# Patient Record
Sex: Female | Born: 1940 | Race: White | Hispanic: No | Smoking: Never smoker
Health system: Southern US, Community
[De-identification: ages and names within clinical notes are randomized; demographics above are authoritative.]

## PROBLEM LIST (undated history)

## (undated) DIAGNOSIS — I10 Essential (primary) hypertension: Secondary | ICD-10-CM

## (undated) DIAGNOSIS — I4891 Unspecified atrial fibrillation: Secondary | ICD-10-CM

## (undated) DIAGNOSIS — M810 Age-related osteoporosis without current pathological fracture: Secondary | ICD-10-CM

## (undated) DIAGNOSIS — C50919 Malignant neoplasm of unspecified site of unspecified female breast: Secondary | ICD-10-CM

## (undated) HISTORY — DX: Essential (primary) hypertension: I10

## (undated) HISTORY — PX: WRIST FRACTURE SURGERY: SHX121

## (undated) HISTORY — DX: Age-related osteoporosis without current pathological fracture: M81.0

## (undated) HISTORY — PX: OTHER SURGICAL HISTORY: SHX169

## (undated) HISTORY — PX: FRACTURE SURGERY: SHX138

---

## 2009-07-15 HISTORY — PX: ANKLE SURGERY: SHX546

## 2011-11-22 DIAGNOSIS — M810 Age-related osteoporosis without current pathological fracture: Secondary | ICD-10-CM | POA: Diagnosis present

## 2014-07-11 ENCOUNTER — Ambulatory Visit: Payer: Self-pay | Admitting: Internal Medicine

## 2015-06-06 ENCOUNTER — Other Ambulatory Visit: Payer: Self-pay | Admitting: Internal Medicine

## 2015-06-06 DIAGNOSIS — Z1231 Encounter for screening mammogram for malignant neoplasm of breast: Secondary | ICD-10-CM

## 2015-07-13 ENCOUNTER — Other Ambulatory Visit: Payer: Self-pay | Admitting: Internal Medicine

## 2015-07-13 ENCOUNTER — Ambulatory Visit
Admission: RE | Admit: 2015-07-13 | Discharge: 2015-07-13 | Disposition: A | Discharge status: Home / Self Care | Payer: Medicare Other | Source: Ambulatory Visit | Attending: Internal Medicine | Admitting: Internal Medicine

## 2015-07-13 DIAGNOSIS — R921 Mammographic calcification found on diagnostic imaging of breast: Secondary | ICD-10-CM | POA: Insufficient documentation

## 2015-07-13 DIAGNOSIS — Z1231 Encounter for screening mammogram for malignant neoplasm of breast: Secondary | ICD-10-CM | POA: Insufficient documentation

## 2015-07-16 DIAGNOSIS — C50919 Malignant neoplasm of unspecified site of unspecified female breast: Secondary | ICD-10-CM

## 2015-07-16 HISTORY — DX: Malignant neoplasm of unspecified site of unspecified female breast: C50.919

## 2015-07-20 ENCOUNTER — Other Ambulatory Visit: Payer: Self-pay | Admitting: Internal Medicine

## 2015-07-20 DIAGNOSIS — R928 Other abnormal and inconclusive findings on diagnostic imaging of breast: Secondary | ICD-10-CM

## 2015-07-28 ENCOUNTER — Ambulatory Visit
Admission: RE | Admit: 2015-07-28 | Discharge: 2015-07-28 | Disposition: A | Discharge status: Home / Self Care | Payer: Medicare Other | Source: Ambulatory Visit | Attending: Internal Medicine | Admitting: Internal Medicine

## 2015-07-28 ENCOUNTER — Ambulatory Visit: Payer: Medicare Other

## 2015-07-28 DIAGNOSIS — Z1231 Encounter for screening mammogram for malignant neoplasm of breast: Secondary | ICD-10-CM | POA: Insufficient documentation

## 2015-07-28 DIAGNOSIS — R921 Mammographic calcification found on diagnostic imaging of breast: Secondary | ICD-10-CM | POA: Insufficient documentation

## 2015-07-28 DIAGNOSIS — R928 Other abnormal and inconclusive findings on diagnostic imaging of breast: Secondary | ICD-10-CM

## 2015-08-01 ENCOUNTER — Other Ambulatory Visit: Payer: Self-pay | Admitting: Internal Medicine

## 2015-08-01 DIAGNOSIS — R920 Mammographic microcalcification found on diagnostic imaging of breast: Secondary | ICD-10-CM

## 2015-08-08 ENCOUNTER — Ambulatory Visit
Admission: RE | Admit: 2015-08-08 | Discharge: 2015-08-08 | Disposition: A | Discharge status: Home / Self Care | Payer: Medicare Other | Source: Ambulatory Visit | Attending: Internal Medicine | Admitting: Internal Medicine

## 2015-08-08 DIAGNOSIS — D0581 Other specified type of carcinoma in situ of right breast: Secondary | ICD-10-CM | POA: Diagnosis not present

## 2015-08-08 DIAGNOSIS — R920 Mammographic microcalcification found on diagnostic imaging of breast: Secondary | ICD-10-CM

## 2015-08-08 DIAGNOSIS — N63 Unspecified lump in breast: Secondary | ICD-10-CM | POA: Diagnosis present

## 2015-08-08 HISTORY — PX: BREAST BIOPSY: SHX20

## 2015-08-09 LAB — SURGICAL PATHOLOGY

## 2015-08-11 ENCOUNTER — Ambulatory Visit (INDEPENDENT_AMBULATORY_CARE_PROVIDER_SITE_OTHER): Payer: Medicare Other | Admitting: General Surgery

## 2015-08-11 ENCOUNTER — Other Ambulatory Visit: Payer: Medicare Other

## 2015-08-11 ENCOUNTER — Encounter: Payer: Self-pay | Admitting: General Surgery

## 2015-08-11 VITALS — BP 124/68 | HR 70 | Resp 14 | Ht 65.0 in | Wt 143.0 lb

## 2015-08-11 DIAGNOSIS — N6320 Unspecified lump in the left breast, unspecified quadrant: Secondary | ICD-10-CM

## 2015-08-11 DIAGNOSIS — N63 Unspecified lump in breast: Secondary | ICD-10-CM | POA: Diagnosis not present

## 2015-08-11 DIAGNOSIS — D0511 Intraductal carcinoma in situ of right breast: Secondary | ICD-10-CM | POA: Diagnosis not present

## 2015-08-11 DIAGNOSIS — N6310 Unspecified lump in the right breast, unspecified quadrant: Secondary | ICD-10-CM

## 2015-08-11 NOTE — Patient Instructions (Signed)
The patient is scheduled for a left breast Finesse biopsy in office on 08/15/15 at 8:15 am. She is scheduled for surgery at [facility] on 08/22/15. The patient will pre admit by phone. She is aware of dates, time, and instructions.

## 2015-08-11 NOTE — Progress Notes (Signed)
Patient ID: Cassie Scarce, PhD, female   DOB: [birth year], 75 y.o.   MRN: [id number]  Chief Complaint  Patient presents with  . Other    mammogram and right breast biopsy    HPI Cassie Scarce, PhD (englishe) is a 75 y.o. female  who presents for a breast evaluation. The most recent mammogram was done on 07/13/15 and added views on 07/28/15 ,right breast stereo performed on 08/08/15.  Patient does not perform regular self breast checks and gets regular mammograms done. The patient had a routine mammogram followed by additional views and subsequent biopsy confirming a 2 mm area of DCIS. She is seen to discuss treatment options.  Her past history is remarkable for a biopsy on the left side in the distant past which was benign.  There is no history of breast trauma. The patient is still employed writing for the local newspaper. She is a native of [place], [place].   The patient is accompanied by her husband, Cassie Grippe, PhD (psych).  I personally reviewed the patient's history.  HPI  Past Medical History  Diagnosis Date  . Hypertension   . Osteoporosis     Past Surgical History  Procedure Laterality Date  . Ankle surgery Left 2011  . Breast biopsy Right 08/08/15    path pending, stereo  . Left breast excision Left     Family History  Problem Relation Age of Onset  . Cancer Neg Hx     Social History Social History  Substance Use Topics  . Smoking status: Never Smoker   . Smokeless tobacco: None  . Alcohol Use: No    Allergies  Allergen Reactions  . Bactericin [Bacitracin] Rash    Current Outpatient Prescriptions  Medication Sig Dispense Refill  . aspirin 81 MG tablet Take 81 mg by mouth daily.    Marland Kitchen b complex vitamins tablet Take 1 tablet by mouth daily.    . Cholecalciferol (VITAMIN D3) 1000 units CAPS Take by mouth daily.    . fenoprofen (NALFON) 600 MG TABS tablet Take 600 mg by mouth.    . Omega-3 Fatty Acids (FISH OIL) 1000 MG CAPS Take by  mouth.    . Selenium 200 MCG CAPS Take by mouth.     No current facility-administered medications for this visit.    Review of Systems Review of Systems  Constitutional: Negative.  Negative for unexpected weight change.  HENT: Negative.   Eyes: Negative.   Respiratory: Negative.   Cardiovascular: Negative.   Endocrine: Negative.   Genitourinary: Negative.   Allergic/Immunologic: Negative.   Neurological: Negative.   Hematological: Negative.   Psychiatric/Behavioral: Negative.     Blood pressure 124/68, pulse 70, resp. rate 14, height 5\' 5"  (1.651 m), weight 143 lb (64.864 kg).  Physical Exam Physical Exam  Constitutional: She is oriented to person, place, and time. She appears well-developed and well-nourished.  Eyes: Conjunctivae are normal. No scleral icterus.  Neck: Neck supple.  Cardiovascular: Normal rate, regular rhythm and normal heart sounds.   Pulmonary/Chest: Effort normal and breath sounds normal. Right breast exhibits no inverted nipple, no nipple discharge, no skin change and no tenderness. Left breast exhibits no inverted nipple, no mass, no nipple discharge, no skin change and no tenderness.    Lymphadenopathy:    She has no cervical adenopathy.    She has no axillary adenopathy.  Neurological: She is alert and oriented to person, place, and time.  Skin: Skin is warm and dry.  Data Reviewed  A. Breast, right, UOQ, biopsy  DIAGNOSIS:  A. 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   Size of DCIS in this sample: 2 mm  Nuclear grade of DCIS: Intermediate  Necrosis: Present, comedo type   ER/PR: Deferred to excision specimen   Bilateral mammograms from 2012, 2015, 2016 and 2017 were independently reviewed. Retrospectively, possible early micro-calcifications noted in the area of present interest in 2015, much more distinct on 2016 films.  There is a density at the  lateral aspect of the left breast more notable in the 2015 and 2016 films with ill-defined borders. No associated microcalcifications.  Ultrasound examination of the area of palpable thickening in the left breast showed a 1.7 x 1.9 x 2.5 cm hypoechoic area with moderately irregular borders this extended down to the pectoralis fascia action making determination of posterior acoustic shadowing difficult. BI-RADS-4.  Examination of the right breast was completed to determine if the biopsy clip was identifiable. At the 10:00 position, 3 cm from the nipple a well-defined biopsy clip is identified.    Assessment    DCIS of left breast, intermediate grade. Reported area of microcalcifications spanning 1.5 cm on mammography, 2 mm area of DCIS on pathology.  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    Plan    We spent almost an hour reviewing options for management of both the known area of DCIS on the right breast as well as the need for biopsy of the left breast mass. I doubt that the left breast mass is malignant, but this certainly needs to be ascertained before surgical management of the right breast.  The biopsy procedure was reviewed.    The patient is scheduled for a left breast Finesse biopsy in office on 08/15/15 at 8:15 am. She is scheduled for surgery at [facility] on 08/22/15. The patient will pre admit by phone. She is aware of dates, time, and instructions.   PCP:  Kirk Ruths This information has been scribed by Gaspar Cola CMA.    Robert Bellow 08/12/2015, 7:36 AM

## 2015-08-12 ENCOUNTER — Encounter: Payer: Self-pay | Admitting: General Surgery

## 2015-08-12 DIAGNOSIS — D0510 Intraductal carcinoma in situ of unspecified breast: Secondary | ICD-10-CM | POA: Insufficient documentation

## 2015-08-12 DIAGNOSIS — N6320 Unspecified lump in the left breast, unspecified quadrant: Secondary | ICD-10-CM | POA: Insufficient documentation

## 2015-08-15 ENCOUNTER — Encounter: Payer: Self-pay | Admitting: *Deleted

## 2015-08-15 ENCOUNTER — Other Ambulatory Visit: Payer: Medicare Other

## 2015-08-15 ENCOUNTER — Inpatient Hospital Stay: Admission: RE | Admit: 2015-08-15 | Payer: Medicare Other | Source: Ambulatory Visit

## 2015-08-15 ENCOUNTER — Encounter: Payer: Self-pay | Admitting: General Surgery

## 2015-08-15 ENCOUNTER — Other Ambulatory Visit: Payer: Self-pay | Admitting: General Surgery

## 2015-08-15 ENCOUNTER — Ambulatory Visit (INDEPENDENT_AMBULATORY_CARE_PROVIDER_SITE_OTHER): Payer: Medicare Other | Admitting: General Surgery

## 2015-08-15 VITALS — BP 132/62 | HR 64 | Resp 12 | Ht 65.5 in | Wt 143.0 lb

## 2015-08-15 DIAGNOSIS — N63 Unspecified lump in breast: Secondary | ICD-10-CM | POA: Diagnosis not present

## 2015-08-15 DIAGNOSIS — N6320 Unspecified lump in the left breast, unspecified quadrant: Secondary | ICD-10-CM

## 2015-08-15 HISTORY — PX: BREAST BIOPSY: SHX20

## 2015-08-15 NOTE — Patient Instructions (Signed)

## 2015-08-15 NOTE — Patient Instructions (Signed)
  Your procedure is scheduled on: 08-22-15 (TUESDAY) Report to [facility] To find out your arrival time please call [phone] between 1PM - 3PM on 08-21-15 [facility])  Remember: Instructions that are not followed completely may result in serious medical risk, up to and including death, or upon the discretion of your surgeon and anesthesiologist your surgery may need to be rescheduled.    _X___ 1. Do not eat food or drink liquids after midnight. No gum chewing or hard candies.     _X___ 2. No Alcohol for 24 hours before or after surgery.   ____ 3. Bring all medications with you on the day of surgery if instructed.    _X___ 4. Notify your doctor if there is any change in your medical condition     (cold, fever, infections).     Do not wear jewelry, make-up, hairpins, clips or nail polish.  Do not wear lotions, powders, or perfumes. You may wear deodorant.  Do not shave 48 hours prior to surgery. Men may shave face and neck.  Do not bring valuables to the hospital.    [facility] is not responsible for any belongings or valuables.               Contacts, dentures or bridgework may not be worn into surgery.  Leave your suitcase in the car. After surgery it may be brought to your room.  For patients admitted to the hospital, discharge time is determined by your treatment team.   Patients discharged the day of surgery will not be allowed to drive home.   Please read over the following fact sheets that you were given:      ____ Take these medicines the morning of surgery with A SIP OF WATER:    1. NONE  2.   3.   4.  5.  6.  ____ Fleet Enema (as directed)   ____ Use CHG Soap as directed  ____ Use inhalers on the day of surgery  ____ Stop metformin 2 days prior to surgery    ____ Take 1/2 of usual insulin dose the night before surgery and none on the morning of surgery.   ____ Stop Coumadin/Plavix/aspirin-PT STOPPED ASPIRIN 2 WEEKS AGO  ____ Stop  Anti-inflammatories   _X__ Stop supplements until after surgery-STOP FISH OIL, B COMPLEX AND SELENIUM NOW  ____ Bring C-Pap to the hospital.

## 2015-08-15 NOTE — H&P (Signed)
HPI Cassie Scarce, PhD (englishe) is a 75 y.o. female who presents for a breast evaluation. The most recent mammogram was done on 07/13/15 and added views on 07/28/15 ,right breast stereo performed on 08/08/15.  Patient does not perform regular self breast checks and gets regular mammograms done. The patient had a routine mammogram followed by additional views and subsequent biopsy confirming a 2 mm area of DCIS. She is seen to discuss treatment options.  Her past history is remarkable for a biopsy on the left side in the distant past which was benign.  There is no history of breast trauma. The patient is still employed writing for the local newspaper. She is a native of [place], [place].  The patient is accompanied by her husband, Arletha Grippe, PhD (psych).  I personally reviewed the patient's history. HPI  Past Medical History  Diagnosis Date  . Hypertension   . Osteoporosis     Past Surgical History  Procedure Laterality Date  . Ankle surgery Left 2011  . Breast biopsy Right 08/08/15    path pending, stereo  . Left breast excision Left     Family History  Problem Relation Age of Onset  . Cancer Neg Hx     Social History Social History  Substance Use Topics  . Smoking status: Never Smoker   . Smokeless tobacco: None  . Alcohol Use: No    Allergies  Allergen Reactions  . Bactericin [Bacitracin] Rash    Current Outpatient Prescriptions  Medication Sig Dispense Refill  . aspirin 81 MG tablet Take 81 mg by mouth daily.    Marland Kitchen b complex vitamins tablet Take 1 tablet by mouth daily.    . Cholecalciferol (VITAMIN D3) 1000 units CAPS Take by mouth daily.    . fenoprofen (NALFON) 600 MG TABS tablet Take 600 mg by mouth.    . Omega-3 Fatty Acids (FISH OIL) 1000 MG CAPS Take by mouth.    . Selenium 200 MCG CAPS Take by mouth.     No current  facility-administered medications for this visit.    Review of Systems Review of Systems  Constitutional: Negative. Negative for unexpected weight change.  HENT: Negative.  Eyes: Negative.  Respiratory: Negative.  Cardiovascular: Negative.  Endocrine: Negative.  Genitourinary: Negative.  Allergic/Immunologic: Negative.  Neurological: Negative.  Hematological: Negative.  Psychiatric/Behavioral: Negative.    Blood pressure 124/68, pulse 70, resp. rate 14, height 5\' 5"  (1.651 m), weight 143 lb (64.864 kg).  Physical Exam Physical Exam  Constitutional: She is oriented to person, place, and time. She appears well-developed and well-nourished.  Eyes: Conjunctivae are normal. No scleral icterus.  Neck: Neck supple.  Cardiovascular: Normal rate, regular rhythm and normal heart sounds.  Pulmonary/Chest: Effort normal and breath sounds normal. Right breast exhibits no inverted nipple, no nipple discharge, no skin change and no tenderness. Left breast exhibits no inverted nipple, no mass, no nipple discharge, no skin change and no tenderness.    Lymphadenopathy:   She has no cervical adenopathy.   She has no axillary adenopathy.  Neurological: She is alert and oriented to person, place, and time.  Skin: Skin is warm and dry.    Data Reviewed  A. Breast, right, UOQ, biopsy  DIAGNOSIS:  A. 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   Size of DCIS in this sample: 2 mm  Nuclear grade of DCIS: Intermediate  Necrosis: Present, comedo type   ER/PR: Deferred to  excision specimen   Bilateral mammograms from 2012, 2015, 2016 and 2017 were independently reviewed. Retrospectively, possible early micro-calcifications noted in the area of present interest in 2015, much more distinct on 2016 films.  There is a density at the lateral aspect of the left breast more notable in the 2015 and 2016  films with ill-defined borders. No associated microcalcifications.  Ultrasound examination of the area of palpable thickening in the left breast showed a 1.7 x 1.9 x 2.5 cm hypoechoic area with moderately irregular borders this extended down to the pectoralis fascia action making determination of posterior acoustic shadowing difficult. BI-RADS-4.  Examination of the right breast was completed to determine if the biopsy clip was identifiable. At the 10:00 position, 3 cm from the nipple a well-defined biopsy clip is identified.    Assessment    DCIS of left breast, intermediate grade. Reported area of microcalcifications spanning 1.5 cm on mammography, 2 mm area of DCIS on pathology.  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    Plan    We spent almost an hour reviewing options for management of both the known area of DCIS on the right breast as well as the need for biopsy of the left breast mass. I doubt that the left breast mass is malignant, but this certainly needs to be ascertained before surgical management of the right breast.  The biopsy procedure was reviewed.    The patient is scheduled for a left breast Finesse biopsy in office on 08/15/15 at 8:15 am. She is scheduled for surgery at [facility] on 08/22/15. The patient will pre admit by phone. She is aware of dates, time, and instructions.   PCP: Kirk Ruths This information has been scribed by Gaspar Cola CMA.    Robert Bellow

## 2015-08-15 NOTE — Progress Notes (Addendum)
Patient ID: Cassie Starch, PhD, female   DOB: 10-Apr-1941, 75 y.o.   MRN: [id number]  Chief Complaint  Patient presents with  . Procedure    left breast biopsy    HPI Cassie Starch, PhD is a 75 y.o. female here today for a left breast biopsy. At the time of her initial assessment on 08/11/2015 for recently diagnosed DCIS involving the right breast there was noted to be asymmetry in the left breast as well as a dominant mass at the 3:00 position. The patient returns today for planned biopsy.  I personally reviewed the patient's history and the planned procedure. HPI  Past Medical History  Diagnosis Date  . Osteoporosis   . Hypertension     OFF MEDS X 6 MONTHS DUE TO CONTROLLED BP (08-15-15)    Past Surgical History  Procedure Laterality Date  . Ankle surgery Left 2011  . Breast biopsy Right 08/08/15    path pending, stereo  . Left breast excision Left   . Wrist fracture surgery      Family History  Problem Relation Age of Onset  . Cancer Neg Hx     Social History Social History  Substance Use Topics  . Smoking status: Never Smoker   . Smokeless tobacco: None  . Alcohol Use: No    Allergies  Allergen Reactions  . Bactericin [Bacitracin] Rash    Current Outpatient Prescriptions  Medication Sig Dispense Refill  . aspirin 81 MG tablet Take 81 mg by mouth daily.    Marland Kitchen b complex vitamins tablet Take 1 tablet by mouth daily.    . Cholecalciferol (VITAMIN D3) 1000 units CAPS Take by mouth daily.    . Omega-3 Fatty Acids (FISH OIL) 1000 MG CAPS Take by mouth.    . Selenium 200 MCG CAPS Take by mouth.    . calcium carbonate (CALCIUM 600) 600 MG TABS tablet Take 600 mg by mouth daily with breakfast.     No current facility-administered medications for this visit.    Review of Systems Review of Systems  Constitutional: Negative.   Respiratory: Negative.   Cardiovascular: Negative.     Blood pressure 132/62, pulse 64, resp. rate 12, height 5' 5.5" (1.664 m), weight 143  lb (64.864 kg).  Physical Exam Physical Exam  Pulmonary/Chest:      Data Reviewed Ultrasound examination of the left breast was completed. The area notable by palpation is poorly defined and heterogeneous in nature.. No associated calcifications on accompanying mammogram or a general increase in density over the last 3 exams is noted. This lesion has irregular borders and no clear vascular flow on duplex imaging. Measurements today are 1.2 x 1.3 x 1.9 cm.   Posterior acoustic shadowing was difficult to ascertain with certainty as the mass appears to sit on the underlying pectoralis fascia. BI-RADS-4.  The area was cleansed with alcohol and 10 mL of 0.5% Xylocaine with 0.25% Marcaine with 1-200,000 of epinephrine was instilled well tolerated. ChloraPrep was applied to the skin. Under ultrasound guidance a 14-gauge Finesse device was inserted and sampling obtained 5 in 2 locations of the mass. The texture of the mass was very firm, similar to that seen in a fibroadenoma. Scant bleeding was noted. A postbiopsy clip was placed. The skin defect was closed with benzoin and Steri-Strips followed by Telfa and Tegaderm dressing. Written instructions for wound care were provided.  Assessment    Successful biopsy of left breast mass.  Known right breast DCIS.    Plan  Plans at present are to proceed with right breast wide excision on 08/22/2015.  The patient will be contacted when today's biopsy results are available. If positive for malignancy will plan to complete management of both sides at the same setting.    PCP:  Ouida Sills This information has been scribed by Gaspar Cola CMA.    Robert Bellow 08/15/2015, 8:26 PM

## 2015-08-16 ENCOUNTER — Other Ambulatory Visit: Payer: Self-pay | Admitting: *Deleted

## 2015-08-16 ENCOUNTER — Telehealth: Payer: Self-pay | Admitting: General Surgery

## 2015-08-16 ENCOUNTER — Encounter
Admission: RE | Admit: 2015-08-16 | Discharge: 2015-08-16 | Disposition: A | Discharge status: Home / Self Care | Payer: Medicare Other | Source: Ambulatory Visit | Attending: General Surgery | Admitting: General Surgery

## 2015-08-16 DIAGNOSIS — D0511 Intraductal carcinoma in situ of right breast: Secondary | ICD-10-CM

## 2015-08-16 DIAGNOSIS — C50919 Malignant neoplasm of unspecified site of unspecified female breast: Secondary | ICD-10-CM

## 2015-08-16 DIAGNOSIS — Z01810 Encounter for preprocedural cardiovascular examination: Secondary | ICD-10-CM | POA: Diagnosis present

## 2015-08-16 NOTE — Pre-Procedure Instructions (Signed)
EKG= Sinus bradycardia with 1st degree AV block. Sept infarct, age undetermined.  Pt is very healthy, exercises 3 times a week and dances on occasion.  Spoke with Dr. Rosey Bath regarding above, he did not order a cardiac clearance, OK to proceed.

## 2015-08-16 NOTE — Progress Notes (Signed)
Patient to have a CA 27-29 drawn at [place] lab on 08-17-15.

## 2015-08-16 NOTE — Telephone Encounter (Signed)
Patient notified that left biopsy showed invasive cancer.  Arrangements in place to review options tomorrow.

## 2015-08-17 ENCOUNTER — Encounter: Payer: Self-pay | Admitting: General Surgery

## 2015-08-17 ENCOUNTER — Ambulatory Visit (INDEPENDENT_AMBULATORY_CARE_PROVIDER_SITE_OTHER): Payer: Medicare Other | Admitting: General Surgery

## 2015-08-17 VITALS — BP 122/68 | HR 64 | Resp 12 | Ht 65.5 in | Wt 144.0 lb

## 2015-08-17 DIAGNOSIS — D0511 Intraductal carcinoma in situ of right breast: Secondary | ICD-10-CM

## 2015-08-17 DIAGNOSIS — C50412 Malignant neoplasm of upper-outer quadrant of left female breast: Secondary | ICD-10-CM

## 2015-08-17 NOTE — Progress Notes (Signed)
Patient ID: Cassie Starch, PhD, female   DOB: [birth year], 75 y.o.   MRN: [id number]  Chief Complaint  Patient presents with  . Follow-up    HPI Cassie Starch, PhD is a 75 y.o. female.  Here today for discussion of biopsy results. The patient had been notified by phone that the left breast biopsy had confirmed invasive mammary cancer. She is accompanied today by her husband.     HPI  Past Medical History  Diagnosis Date  . Osteoporosis   . Hypertension     OFF MEDS X 6 MONTHS DUE TO CONTROLLED BP (08-15-15)    Past Surgical History  Procedure Laterality Date  . Ankle surgery Left 2011  . Left breast excision Left   . Wrist fracture surgery    . Breast biopsy Right 08/08/15    stereo/DUCTAL CARCINOMA IN SITU   . Breast biopsy Left 08-15-15    INVASIVE CARCINOMA    Family History  Problem Relation Age of Onset  . Cancer Neg Hx     Social History Social History  Substance Use Topics  . Smoking status: Never Smoker   . Smokeless tobacco: None  . Alcohol Use: No    Allergies  Allergen Reactions  . Bactericin [Bacitracin] Rash    bactrim    Current Outpatient Prescriptions  Medication Sig Dispense Refill  . aspirin 81 MG tablet Take 81 mg by mouth daily.    Marland Kitchen b complex vitamins tablet Take 1 tablet by mouth daily.    . calcium carbonate (CALCIUM 600) 600 MG TABS tablet Take 600 mg by mouth daily with breakfast.    . Cholecalciferol (VITAMIN D3) 1000 units CAPS Take by mouth daily.    . Omega-3 Fatty Acids (FISH OIL) 1000 MG CAPS Take by mouth.    . Selenium 200 MCG CAPS Take by mouth.     No current facility-administered medications for this visit.    Review of Systems Review of Systems  Constitutional: Negative.   Respiratory: Negative.   Cardiovascular: Negative.     Blood pressure 122/68, pulse 64, resp. rate 12, height 5' 5.5" (1.664 m), weight 144 lb (65.318 kg).  Physical Exam Physical Exam  Constitutional: She is oriented to person, place, and time.  She appears well-developed and well-nourished.  Pulmonary/Chest:  Minimal bruising at biopsy site per nursing report.  Neurological: She is alert and oriented to person, place, and time.  Skin: Skin is warm and dry.    Data Reviewed  Diagnosis Breast, left, needle core biopsy, 3:00 o'clock - INVASIVE CARCINOMA. - SEE COMMENT. Microscopic Comment The carcinoma is grade 2. An E-Cadherin stain will be performed and the results reported separately. A breast prognostic profile will be performed and the results reported separately. The case was discussed with Dr. Bary Castilla on 08/16/2015. (JBK;kh 08/16/15)  IMMUNOHISTOCHEMICAL AND MORPHOMETRIC ANALYSIS PERFORMED MANUALLY Estrogen Receptor: 90%, POSITIVE, STRONG STAINING INTENSITY Progesterone Receptor: 90%, POSITIVE, STRONG STAINING INTENSITY Proliferation Marker Ki67: 5% REFERENCE RANGE ESTROGEN RECEPTOR NEGATIVE 0% POSITIVE =>1% REFERENCE RANGE PROGESTERONE RECEPTOR NEGATIVE 0% POSITIVE =>1% All controls stained appropriately Claudette Laws MD Pathologist, Electronic Signature ( Signed 08/17/2015) The malignant cells are positive for E-Cadherin, supporting a ductal phenotype. (JBK:kh 08-16-15) Enid Cutter MD Pathologist, Electronic Signature ( Signed 08/16/2015) FINAL DIAGNOSIS      Assessment    Invasive mammary carcinoma the left breast.  DCIS of the right breast.    Plan    The entire 45 minute visit was spent reviewing options for management  of the invasive carcinoma recently diagnosed in the left breast. Mastectomy and breast conservation were presented as equivalent procedures. Her tumor is plus or -2 cm in diameter, and with her breast volume she might have significant volume discrepancy with the right. This is not mean that it can't be done, but needs to be considered. Although on the original ultrasound the area measured over 2 cm, at the time of biopsy it was slightly less than this. If she was interested in breast  conservation consideration could be given to neoadjuvant chemotherapy to shrink the area. HER-2/neu analysis is still pending, in that this was positive there would be a strong case for neoadjuvant chemotherapy regardless of size or plans for breast conservation.  We discussed in detail that what we due to the breast doesn't determine whether she will be encouraged or discouraged from having adjuvant chemotherapy. With the ER and PR positive, that is certainly encouraging that adjuvant treatment would be unlikely necessary.  At the time of the end of our discussion she is leaning towards mastectomy on the left and wide excision on the right. This is certainly reasonable although consideration could be given to bilateral mastectomy.  An informational brochure was provided. The offer to obtain a second surgical opinion or to meet with medical oncology and/or radiation oncology was reviewed.  She is certainly aware that the tentative date for February 7, originally scheduled [place] only were dealing with DCIS on the left is still open, but this can certainly be postponed if she's not ready to make a final decision regarding treatment choices. I emphasized to both she and her husband that were not in any hurry to operate, it's more importantly we have a good treatment plan outlined.   Patient to have the following labs drawn at [facility] lab today: CBC, Met C, and CA 27-29.  This patient's surgery has been scheduled for 08-22-15 at [facility]. It is okay for patient to continue 81 mg aspirin once daily.      PCP:  Ouida Sills This information has been scribed by Karie Fetch RNBC.    Robert Bellow 08/18/2015, 10:24 AM

## 2015-08-17 NOTE — Patient Instructions (Signed)
Patient to have labs drawn today.

## 2015-08-18 ENCOUNTER — Telehealth: Payer: Self-pay | Admitting: General Surgery

## 2015-08-18 DIAGNOSIS — C50412 Malignant neoplasm of upper-outer quadrant of left female breast: Secondary | ICD-10-CM | POA: Insufficient documentation

## 2015-08-18 DIAGNOSIS — Z170 Estrogen receptor positive status [ER+]: Secondary | ICD-10-CM | POA: Insufficient documentation

## 2015-08-18 LAB — COMPREHENSIVE METABOLIC PANEL
A/G RATIO: 1.8 (ref 1.1–2.5)
ALT: 11 IU/L (ref 0–32)
AST: 23 IU/L (ref 0–40)
Albumin: 4.5 g/dL (ref 3.5–4.8)
Alkaline Phosphatase: 46 IU/L (ref 39–117)
BILIRUBIN TOTAL: 0.5 mg/dL (ref 0.0–1.2)
BUN/Creatinine Ratio: 16 (ref 11–26)
BUN: 15 mg/dL (ref 8–27)
CHLORIDE: 101 mmol/L (ref 96–106)
CO2: 26 mmol/L (ref 18–29)
Calcium: 9.8 mg/dL (ref 8.7–10.3)
Creatinine, Ser: 0.93 mg/dL (ref 0.57–1.00)
GFR calc non Af Amer: 60 mL/min/{1.73_m2} (ref >59)
GFR, EST AFRICAN AMERICAN: 69 mL/min/{1.73_m2} (ref >59)
GLOBULIN, TOTAL: 2.5 g/dL (ref 1.5–4.5)
Glucose: 98 mg/dL (ref 65–99)
POTASSIUM: 4.2 mmol/L (ref 3.5–5.2)
SODIUM: 141 mmol/L (ref 134–144)
TOTAL PROTEIN: 7 g/dL (ref 6.0–8.5)

## 2015-08-18 LAB — CBC WITH DIFFERENTIAL/PLATELET
BASOS ABS: 0 10*3/uL (ref 0.0–0.2)
Basos: 1 %
EOS (ABSOLUTE): 0.2 10*3/uL (ref 0.0–0.4)
Eos: 4 %
HEMOGLOBIN: 13 g/dL (ref 11.1–15.9)
Hematocrit: 38.8 % (ref 34.0–46.6)
Immature Grans (Abs): 0 10*3/uL (ref 0.0–0.1)
Immature Granulocytes: 0 %
LYMPHS ABS: 1.3 10*3/uL (ref 0.7–3.1)
Lymphs: 32 %
MCH: 30.3 pg (ref 26.6–33.0)
MCHC: 33.5 g/dL (ref 31.5–35.7)
MCV: 90 fL (ref 79–97)
MONOCYTES: 12 %
MONOS ABS: 0.5 10*3/uL (ref 0.1–0.9)
NEUTROS ABS: 2.1 10*3/uL (ref 1.4–7.0)
Neutrophils: 51 %
PLATELETS: 227 10*3/uL (ref 150–379)
RBC: 4.29 x10E6/uL (ref 3.77–5.28)
RDW: 13.8 % (ref 12.3–15.4)
WBC: 4.1 10*3/uL (ref 3.4–10.8)

## 2015-08-18 LAB — CANCER ANTIGEN 27.29: CA 27.29: 36.7 U/mL (ref 0.0–38.6)

## 2015-08-18 NOTE — Telephone Encounter (Signed)
The patient was notified that all of her laboratory studies were within normal limits.  She has an ER/PR positive tumor. HER-2/neu is pending.  Based on yesterday's discussion she has decided she would like to proceed with bilateral mastectomy. I think this is perfectly reasonable.  We will need to coordinate sentinel node injection, and if this and be done early on February 7, the original day of surgery, we can proceed. If it can't be done early, we may need to move the OR to February 9. She is aware of the scheduling issue.

## 2015-08-21 ENCOUNTER — Telehealth: Payer: Self-pay | Admitting: *Deleted

## 2015-08-21 ENCOUNTER — Other Ambulatory Visit: Payer: Self-pay | Admitting: *Deleted

## 2015-08-21 ENCOUNTER — Ambulatory Visit: Payer: Self-pay | Admitting: General Surgery

## 2015-08-21 DIAGNOSIS — C50412 Malignant neoplasm of upper-outer quadrant of left female breast: Secondary | ICD-10-CM

## 2015-08-21 NOTE — Telephone Encounter (Signed)
Patient's surgery has been moved from 08-22-15 to 08-24-15 at [facility]. This patient is to check-in at the radiology desk at 9:30 am. She is aware of date, time, and instructions. Patient verbalizes understanding.

## 2015-08-21 NOTE — Telephone Encounter (Signed)
-----   Message from Robert Bellow, MD sent at 08/18/2015  1:18 PM EST ----- There has been a change in plans: Bilateral mastectomy with left sentinel node biopsy.  If the patient can have a sentinel node injection first thing Tuesday morning we can proceed on Tuesday. If they can't get it done earlly, we will need to move the case to Thursday, otherwise endo will be pushed way behind. Knee scheduling sheet in your box. Patient is aware that he'll be giving her a call regarding final date and time for launch. Thank you

## 2015-08-22 ENCOUNTER — Other Ambulatory Visit: Payer: Self-pay | Admitting: General Surgery

## 2015-08-22 NOTE — Progress Notes (Signed)
  Oncology Nurse Navigator Documentation  Navigator Location: [facility] (08/22/15 1000) Navigator Encounter Type: Introductory phone call (08/22/15 1000)   Abnormal Finding Date: 08/08/15 (08/22/15 1000) Confirmed Diagnosis Date: 08/15/15 (08/22/15 1000) Surgery Date: 08/24/15 (08/22/15 1000)   Patient Visit Type: Initial (08/22/15 1000)   Barriers/Navigation Needs: Education (08/22/15 1000) Education: Coping with Diagnosis/ Prognosis;Newly Diagnosed Cancer Education (08/22/15 1000)            Specialty Items/DME: Prosthesis (08/22/15 1000) Acuity: Level 1 (08/22/15 1000) Acuity Level 1: Initial guidance, education and coordination as needed (08/22/15 1000)       Time Spent with Patient: 30 (08/22/15 1000)  Phoned patient to introduce navigation service.  Her surgery as been changed to 08/24/15.  She is scheduled for a  Bilateral mastectomy with sentinel node biopsy with Dr. Bary Castilla.  Questions about where to purchase prostheses answered.  Explained she will need clearance from Dr. Bary Castilla post-op prior to fitting.  History, and risk factors recorded for upcoming breast case conference.

## 2015-08-23 ENCOUNTER — Telehealth: Payer: Self-pay | Admitting: *Deleted

## 2015-08-23 ENCOUNTER — Other Ambulatory Visit: Payer: Self-pay | Admitting: *Deleted

## 2015-08-23 DIAGNOSIS — D0511 Intraductal carcinoma in situ of right breast: Secondary | ICD-10-CM

## 2015-08-23 DIAGNOSIS — C50412 Malignant neoplasm of upper-outer quadrant of left female breast: Secondary | ICD-10-CM

## 2015-08-23 NOTE — Telephone Encounter (Signed)
New order in [facility] for bilateral SLN biopsy. Patient's surgery is scheduled for tomorrow, 08-24-15 at [facility]. Arrival time for patient will remain the same per the scheduling department.   Dr. Bary Castilla has signed the order in EPIC.  Abby in Nuclear Medicine was notified of this today.   Message left on the O.R. Posting line as well.

## 2015-08-23 NOTE — Telephone Encounter (Signed)
-----   Message from Robert Bellow, MD sent at 08/22/2015  8:25 AM EST ----- Schedule for bilateral sentinel node study. Thanks.

## 2015-08-24 ENCOUNTER — Observation Stay
Admission: RE | Admit: 2015-08-24 | Discharge: 2015-08-25 | Disposition: A | Discharge status: Home / Self Care | Payer: Medicare Other | Source: Ambulatory Visit | Attending: General Surgery | Admitting: General Surgery

## 2015-08-24 ENCOUNTER — Encounter
Admission: RE | Disposition: A | Discharge status: Home / Self Care | Payer: Self-pay | Source: Ambulatory Visit | Attending: General Surgery

## 2015-08-24 ENCOUNTER — Ambulatory Visit: Payer: Medicare Other | Admitting: Certified Registered Nurse Anesthetist

## 2015-08-24 ENCOUNTER — Encounter: Payer: Self-pay | Admitting: *Deleted

## 2015-08-24 ENCOUNTER — Ambulatory Visit
Admission: RE | Admit: 2015-08-24 | Discharge: 2015-08-24 | Disposition: A | Discharge status: Home / Self Care | Payer: Medicare Other | Source: Ambulatory Visit | Attending: General Surgery | Admitting: General Surgery

## 2015-08-24 DIAGNOSIS — C50412 Malignant neoplasm of upper-outer quadrant of left female breast: Secondary | ICD-10-CM

## 2015-08-24 DIAGNOSIS — M810 Age-related osteoporosis without current pathological fracture: Secondary | ICD-10-CM | POA: Insufficient documentation

## 2015-08-24 DIAGNOSIS — Z170 Estrogen receptor positive status [ER+]: Secondary | ICD-10-CM | POA: Insufficient documentation

## 2015-08-24 DIAGNOSIS — C50919 Malignant neoplasm of unspecified site of unspecified female breast: Secondary | ICD-10-CM | POA: Diagnosis present

## 2015-08-24 DIAGNOSIS — C773 Secondary and unspecified malignant neoplasm of axilla and upper limb lymph nodes: Secondary | ICD-10-CM | POA: Insufficient documentation

## 2015-08-24 DIAGNOSIS — C50411 Malignant neoplasm of upper-outer quadrant of right female breast: Secondary | ICD-10-CM | POA: Diagnosis not present

## 2015-08-24 DIAGNOSIS — C50911 Malignant neoplasm of unspecified site of right female breast: Secondary | ICD-10-CM | POA: Insufficient documentation

## 2015-08-24 DIAGNOSIS — Z7982 Long term (current) use of aspirin: Secondary | ICD-10-CM | POA: Diagnosis not present

## 2015-08-24 DIAGNOSIS — Z79899 Other long term (current) drug therapy: Secondary | ICD-10-CM | POA: Diagnosis not present

## 2015-08-24 DIAGNOSIS — D0511 Intraductal carcinoma in situ of right breast: Secondary | ICD-10-CM

## 2015-08-24 DIAGNOSIS — I10 Essential (primary) hypertension: Secondary | ICD-10-CM | POA: Insufficient documentation

## 2015-08-24 HISTORY — PX: BILATERAL TOTAL MASTECTOMY WITH AXILLARY LYMPH NODE DISSECTION: SHX6364

## 2015-08-24 HISTORY — PX: MASTECTOMY: SHX3

## 2015-08-24 HISTORY — PX: SENTINEL NODE BIOPSY: SHX6608

## 2015-08-24 SURGERY — BILATERAL TOTAL MASTECTOMY WITH AXILLARY LYMPH NODE DISSECTION
Anesthesia: General | Laterality: Bilateral | Wound class: Clean

## 2015-08-24 MED ORDER — ACETAMINOPHEN 650 MG RE SUPP: 650.0000 mg | Freq: Four times a day (QID) | RECTAL | Status: DC | PRN

## 2015-08-24 MED ORDER — ACETAMINOPHEN 325 MG PO TABS: 650.0000 mg | ORAL_TABLET | Freq: Four times a day (QID) | ORAL | Status: DC | PRN

## 2015-08-24 MED ORDER — SODIUM CHLORIDE FLUSH 0.9 % IV SOLN
INTRAVENOUS | Status: AC
  Filled 2015-08-24: qty 10

## 2015-08-24 MED ORDER — PROPOFOL 10 MG/ML IV BOLUS
INTRAVENOUS | Status: DC | PRN
  Administered 2015-08-24: 100 mg via INTRAVENOUS

## 2015-08-24 MED ORDER — TECHNETIUM TC 99M SULFUR COLLOID
1.1200 | Freq: Once | INTRAVENOUS | Status: AC | PRN
  Administered 2015-08-24: 1.12 via INTRAVENOUS

## 2015-08-24 MED ORDER — METHYLENE BLUE 0.5 % INJ SOLN
INTRAVENOUS | Status: DC | PRN
  Administered 2015-08-24: 3 mL

## 2015-08-24 MED ORDER — HYDROCODONE-ACETAMINOPHEN 5-325 MG PO TABS: 1.0000 | ORAL_TABLET | ORAL | Status: DC | PRN

## 2015-08-24 MED ORDER — SODIUM CHLORIDE FLUSH 0.9 % IV SOLN
INTRAVENOUS | Status: AC
  Filled 2015-08-24: qty 20

## 2015-08-24 MED ORDER — DEXAMETHASONE SODIUM PHOSPHATE 10 MG/ML IJ SOLN
INTRAMUSCULAR | Status: DC | PRN
  Administered 2015-08-24: 10 mg via INTRAVENOUS

## 2015-08-24 MED ORDER — ONDANSETRON HCL 4 MG/2ML IJ SOLN: 4.0000 mg | Freq: Four times a day (QID) | INTRAMUSCULAR | Status: DC | PRN

## 2015-08-24 MED ORDER — ONDANSETRON HCL 4 MG PO TABS: 4.0000 mg | ORAL_TABLET | Freq: Four times a day (QID) | ORAL | Status: DC | PRN

## 2015-08-24 MED ORDER — METHYLENE BLUE 0.5 % INJ SOLN
INTRAVENOUS | Status: AC
  Filled 2015-08-24: qty 10

## 2015-08-24 MED ORDER — FAMOTIDINE 20 MG PO TABS
ORAL_TABLET | ORAL | Status: AC
  Administered 2015-08-24: 20 mg via ORAL
  Filled 2015-08-24: qty 1

## 2015-08-24 MED ORDER — KETOROLAC TROMETHAMINE 15 MG/ML IJ SOLN
INTRAMUSCULAR | Status: DC | PRN
  Administered 2015-08-24: 15 mg via INTRAVENOUS

## 2015-08-24 MED ORDER — FENTANYL CITRATE (PF) 100 MCG/2ML IJ SOLN
INTRAMUSCULAR | Status: AC
  Administered 2015-08-24: 25 ug via INTRAVENOUS
  Filled 2015-08-24: qty 2

## 2015-08-24 MED ORDER — FENTANYL CITRATE (PF) 100 MCG/2ML IJ SOLN
INTRAMUSCULAR | Status: DC | PRN
  Administered 2015-08-24: 50 ug via INTRAVENOUS
  Administered 2015-08-24: 25 ug via INTRAVENOUS
  Administered 2015-08-24 (×2): 50 ug via INTRAVENOUS
  Administered 2015-08-24: 25 ug via INTRAVENOUS

## 2015-08-24 MED ORDER — FAMOTIDINE 20 MG PO TABS
20.0000 mg | ORAL_TABLET | Freq: Once | ORAL | Status: AC
  Administered 2015-08-24: 20 mg via ORAL

## 2015-08-24 MED ORDER — TECHNETIUM TC 99M SULFUR COLLOID
1.1100 | Freq: Once | INTRAVENOUS | Status: AC | PRN
  Administered 2015-08-24: 1.11 via INTRAVENOUS

## 2015-08-24 MED ORDER — ONDANSETRON HCL 4 MG/2ML IJ SOLN
4.0000 mg | Freq: Once | INTRAMUSCULAR | Status: AC | PRN
  Administered 2015-08-24: 4 mg via INTRAVENOUS

## 2015-08-24 MED ORDER — FENTANYL CITRATE (PF) 100 MCG/2ML IJ SOLN
25.0000 ug | INTRAMUSCULAR | Status: AC | PRN
  Administered 2015-08-24 (×6): 25 ug via INTRAVENOUS

## 2015-08-24 MED ORDER — HYDROCODONE-ACETAMINOPHEN 5-325 MG PO TABS
1.0000 | ORAL_TABLET | ORAL | Status: DC | PRN
  Administered 2015-08-24: 1 via ORAL

## 2015-08-24 MED ORDER — SODIUM CHLORIDE 0.9 % IJ SOLN
INTRAMUSCULAR | Status: AC
  Filled 2015-08-24: qty 10

## 2015-08-24 MED ORDER — LACTATED RINGERS IV SOLN
INTRAVENOUS | Status: DC
  Administered 2015-08-24 (×2): via INTRAVENOUS

## 2015-08-24 MED ORDER — HYDROCODONE-ACETAMINOPHEN 5-325 MG PO TABS
ORAL_TABLET | ORAL | Status: AC
  Administered 2015-08-24: 1 via ORAL
  Filled 2015-08-24: qty 1

## 2015-08-24 MED ORDER — ONDANSETRON HCL 4 MG/2ML IJ SOLN
INTRAMUSCULAR | Status: AC
  Filled 2015-08-24: qty 2

## 2015-08-24 MED ORDER — SODIUM CHLORIDE 0.9 % IV SOLN
INTRAVENOUS | Status: DC
  Administered 2015-08-24: 21:00:00 via INTRAVENOUS

## 2015-08-24 SURGICAL SUPPLY — 54 items
APPLIER CLIP 11 MED OPEN (CLIP)
APPLIER CLIP 13 LRG OPEN (CLIP)
BANDAGE ELASTIC 6 LF NS (GAUZE/BANDAGES/DRESSINGS) ×3 IMPLANT
BLADE SURG 15 STRL SS SAFETY (BLADE) ×3 IMPLANT
BNDG GAUZE 4.5X4.1 6PLY STRL (MISCELLANEOUS) ×3 IMPLANT
BULB RESERV EVAC DRAIN JP 100C (MISCELLANEOUS) ×6 IMPLANT
CANISTER SUCT 1200ML W/VALVE (MISCELLANEOUS) ×3 IMPLANT
CHLORAPREP W/TINT 26ML (MISCELLANEOUS) ×6 IMPLANT
CLIP APPLIE 11 MED OPEN (CLIP) IMPLANT
CLIP APPLIE 13 LRG OPEN (CLIP) IMPLANT
CLOSURE WOUND 1/2 X4 (GAUZE/BANDAGES/DRESSINGS) ×2
CNTNR SPEC 2.5X3XGRAD LEK (MISCELLANEOUS) ×2
CONT SPEC 4OZ STER OR WHT (MISCELLANEOUS) ×4
CONTAINER SPEC 2.5X3XGRAD LEK (MISCELLANEOUS) ×2 IMPLANT
COVER PROBE FLX POLY STRL (MISCELLANEOUS) IMPLANT
DEVICE DUBIN SPECIMEN MAMMOGRA (MISCELLANEOUS) IMPLANT
DRAIN CHANNEL JP 15F RND 16 (MISCELLANEOUS) ×6 IMPLANT
DRAPE LAPAROTOMY TRNSV 106X77 (MISCELLANEOUS) ×3 IMPLANT
DRSG TELFA 3X8 NADH (GAUZE/BANDAGES/DRESSINGS) ×6 IMPLANT
ELECT CAUTERY BLADE TIP 2.5 (TIP) ×3
ELECT REM PT RETURN 9FT ADLT (ELECTROSURGICAL) ×3
ELECTRODE CAUTERY BLDE TIP 2.5 (TIP) ×1 IMPLANT
ELECTRODE REM PT RTRN 9FT ADLT (ELECTROSURGICAL) ×1 IMPLANT
GAUZE FLUFF 18X24 1PLY STRL (GAUZE/BANDAGES/DRESSINGS) ×6 IMPLANT
GAUZE SPONGE 4X4 12PLY STRL (GAUZE/BANDAGES/DRESSINGS) IMPLANT
GLOVE BIO SURGEON STRL SZ7.5 (GLOVE) ×9 IMPLANT
GLOVE INDICATOR 8.0 STRL GRN (GLOVE) ×9 IMPLANT
GOWN STRL REUS W/ TWL LRG LVL3 (GOWN DISPOSABLE) ×3 IMPLANT
GOWN STRL REUS W/TWL LRG LVL3 (GOWN DISPOSABLE) ×6
KIT RM TURNOVER STRD PROC AR (KITS) ×3 IMPLANT
LABEL OR SOLS (LABEL) IMPLANT
NDL SAFETY 18GX1.5 (NEEDLE) ×3 IMPLANT
NDL SAFETY 22GX1.5 (NEEDLE) ×6 IMPLANT
PACK BASIN MINOR ARMC (MISCELLANEOUS) ×3 IMPLANT
PIN SAFETY STRL (MISCELLANEOUS) ×3 IMPLANT
SHEARS FOC LG CVD HARMONIC 17C (MISCELLANEOUS) IMPLANT
SLEVE PROBE SENORX GAMMA FIND (MISCELLANEOUS) ×3 IMPLANT
SPONGE LAP 18X18 5 PK (GAUZE/BANDAGES/DRESSINGS) ×3 IMPLANT
STRIP CLOSURE SKIN 1/2X4 (GAUZE/BANDAGES/DRESSINGS) ×4 IMPLANT
SUT ETHILON 3-0 FS-10 30 BLK (SUTURE) ×3
SUT SILK 0 (SUTURE) ×2
SUT SILK 0 30XBRD TIE 6 (SUTURE) ×1 IMPLANT
SUT SILK 3 0 (SUTURE)
SUT SILK 3-0 18XBRD TIE 12 (SUTURE) IMPLANT
SUT VIC AB 2-0 CT1 27 (SUTURE) ×8
SUT VIC AB 2-0 CT1 TAPERPNT 27 (SUTURE) ×4 IMPLANT
SUT VIC AB 2-0 CT2 27 (SUTURE) ×6 IMPLANT
SUT VIC AB 3-0 SH 27 (SUTURE) ×2
SUT VIC AB 3-0 SH 27X BRD (SUTURE) ×1 IMPLANT
SUT VICRYL+ 3-0 144IN (SUTURE) ×3 IMPLANT
SUTURE EHLN 3-0 FS-10 30 BLK (SUTURE) ×1 IMPLANT
SWABSTK COMLB BENZOIN TINCTURE (MISCELLANEOUS) ×3 IMPLANT
SYRINGE 10CC LL (SYRINGE) ×3 IMPLANT
TAPE TRANSPORE STRL 2 31045 (GAUZE/BANDAGES/DRESSINGS) ×3 IMPLANT

## 2015-08-24 NOTE — OR Nursing (Signed)
Per Dr. Bary Castilla, may start IV in the right hand/arm.

## 2015-08-24 NOTE — OR Nursing (Signed)
Dr. Ronelle Nigh has viewed EKG monitor with frequent PAC's per him and reports patient is okay for surgery.

## 2015-08-24 NOTE — Op Note (Signed)
Preoperative diagnosis: Right breast DCIS, left breast invasive mammary carcinoma.  Postoperative diagnosis: Same.  Operative procedure: Right simple mastectomy with sentinel node biopsy; left simple mastectomy with sentinel node biopsy.  Operating surgeon: Hervey Ard, M.D. anesthesia: Gen. by LMA.  Estimated blood loss: 50 mL.  Clinical note: This 75 year old has been identified with bilateral breast cancer. She is chosen mastectomy for treatment. The patient was injected with technetium sulfur colloid prior to presentation to the operating theater.  Operative note:  After the induction of anesthesia a total volume of 9 mL of saline/methylene blue diluted 2:1 was injected with half into each subareolar plexus. The breasts, chest, neck and axillas were then prepped with ChloraPrep and draped. Bilateral elliptical incisions were outlined. The right breast was approached first.. Skin flaps were incised sharply and the remaining dissection completed with electrocautery. Margins of dissection were the area just below the clavicle superiorly, sternum medially, rectus fascia inferiorly and the serratus muscle laterally. A single hot, blue lymph node was identified and sent for routine histology. The breast was elevated off the underlying pectoralis muscle taking the fascia with the specimen. Either stasis was electrocautery and 3-0 Vicryl ties. The area was irrigated with sterile water and a Blake drain brought up to the inferior medial flap, anchored in place with a 3-0 nylon suture. The flaps were then approximated with a running 2-0 Vicryl deep dermal suture in 2 segments. Sterile tape was placed over the wound and attention turned the left breast.  The left breast was treated identically to the right with flaps as noted above. Axillary exam again showed a single hot, blue lymph node which was sent in formalin for routine histology. Margins of resection and fascia resection were as noted above. The  wound was irrigated with sterile water and a Blake drain brought out through the inferior medial flap. The wound was closed as noted above. Benzoin, Steri-Strips, hollow by a Telfa pad were placed over both wounds. The drains were placed to self suction. Fluff gauze, Kerlix and Ace wrap was applied.  The patient tolerated the procedure well and was taken to the recovery room stable condition.

## 2015-08-24 NOTE — Progress Notes (Signed)
Patient weak post surgery. Concerned about drain care. Pale in day surgery. Will admit for IV fluids overnight.

## 2015-08-24 NOTE — Discharge Instructions (Signed)

## 2015-08-24 NOTE — OR Nursing (Signed)
Patient arrived to [facility] from nuclear med. Placed on dinamap for vs. And noted to have variable HR from 33-66 bpm. Patient denies feeling faint, SOB or chest pain. Connected to EKG print out monitor for evaluation.

## 2015-08-24 NOTE — OR Nursing (Signed)
Dr Byrnett in to see pt  

## 2015-08-24 NOTE — OR Nursing (Signed)
Dr Bary Castilla notified of amount of drainage from left JP drain and amount of clots. Pt agrees to stay overnight and md will put orders in

## 2015-08-24 NOTE — Transfer of Care (Signed)
Immediate Anesthesia Transfer of Care Note  Patient: Cassie Starch, PhD  Procedure(s) Performed: Procedure(s): BILATERAL TOTAL MASTECTOMY WITH AXILLARY LYMPH NODE DISSECTION (Bilateral) SENTINEL NODE BIOPSY (Bilateral)  Patient Location: PACU  Anesthesia Type:General  Level of Consciousness: sedated  Airway & Oxygen Therapy: Patient Spontanous Breathing and Patient connected to face mask oxygen  Post-op Assessment: Report given to RN and Post -op Vital signs reviewed and stable  Post vital signs: Reviewed and stable  Last Vitals:  Filed Vitals:   08/24/15 1023 08/24/15 1415  BP: 160/70 164/67  Pulse: 48 36  Temp: 35.5 C 36.3 C  Resp: 16 13    Complications: No apparent anesthesia complications

## 2015-08-24 NOTE — Anesthesia Procedure Notes (Signed)
Procedure Name: LMA Insertion Date/Time: 08/24/2015 12:01 PM Performed by: Delaney Meigs Pre-anesthesia Checklist: Patient identified, Emergency Drugs available, Suction available, Patient being monitored and Timeout performed Patient Re-evaluated:Patient Re-evaluated prior to inductionOxygen Delivery Method: Circle system utilized and Simple face mask Preoxygenation: Pre-oxygenation with 100% oxygen Intubation Type: IV induction Ventilation: Mask ventilation without difficulty LMA Size: 3.0 Number of attempts: 1 Placement Confirmation: breath sounds checked- equal and bilateral and positive ETCO2 Tube secured with: Tape

## 2015-08-24 NOTE — Anesthesia Postprocedure Evaluation (Signed)
Anesthesia Post Note  Patient: Cassie Starch, PhD  Procedure(s) Performed: Procedure(s) (LRB): BILATERAL TOTAL MASTECTOMY WITH AXILLARY LYMPH NODE DISSECTION (Bilateral) SENTINEL NODE BIOPSY (Bilateral)  Patient location during evaluation: PACU Anesthesia Type: General Level of consciousness: awake and alert Pain management: pain level controlled Vital Signs Assessment: post-procedure vital signs reviewed and stable Respiratory status: spontaneous breathing and respiratory function stable Cardiovascular status: stable Anesthetic complications: no    Last Vitals:  Filed Vitals:   08/24/15 1430 08/24/15 1444  BP: 152/101 135/84  Pulse: 81 84  Temp:    Resp: 12 16    Last Pain:  Filed Vitals:   08/24/15 1458  PainSc: 7                  Donelle Hise K

## 2015-08-24 NOTE — H&P (Signed)
No change in clinical condition.  For bilateral simple mastectomy and SLN biopsy for management of right DCIS and left IDC.

## 2015-08-24 NOTE — Anesthesia Preprocedure Evaluation (Addendum)
Anesthesia Evaluation  Patient identified by MRN, date of birth, ID band Patient awake    Reviewed: Allergy & Precautions, NPO status , Patient's Chart, lab work & pertinent test results  History of Anesthesia Complications Negative for: history of anesthetic complications  Airway Mallampati: II       Dental   Permanent upper plate:   Pulmonary neg pulmonary ROS,           Cardiovascular hypertension (hx, no meds now), negative cardio ROS       Neuro/Psych negative neurological ROS     GI/Hepatic negative GI ROS, Neg liver ROS,   Endo/Other  negative endocrine ROS  Renal/GU negative Renal ROS     Musculoskeletal   Abdominal   Peds  Hematology negative hematology ROS (+)   Anesthesia Other Findings   Reproductive/Obstetrics                            Anesthesia Physical Anesthesia Plan  ASA: II  Anesthesia Plan: General   Post-op Pain Management:    Induction: Intravenous  Airway Management Planned: Oral ETT and LMA  Additional Equipment:   Intra-op Plan:   Post-operative Plan:   Informed Consent: I have reviewed the patients History and Physical, chart, labs and discussed the procedure including the risks, benefits and alternatives for the proposed anesthesia with the patient or authorized representative who has indicated his/her understanding and acceptance.     Plan Discussed with:   Anesthesia Plan Comments:         Anesthesia Quick Evaluation

## 2015-08-24 NOTE — OR Nursing (Signed)
Pt to room 209 with husband at side

## 2015-08-25 ENCOUNTER — Encounter: Payer: Self-pay | Admitting: General Surgery

## 2015-08-25 ENCOUNTER — Telehealth: Payer: Self-pay | Admitting: *Deleted

## 2015-08-25 NOTE — Discharge Summary (Signed)
Good pain control. Patient comfortable with drain care.

## 2015-08-25 NOTE — Telephone Encounter (Signed)
Called to follow up with patient post surgery yesterday.  States she is able to empty her drains.  She is in pain, but has not taken her pain meds yet.  Encouraged to take them as soon as possible.

## 2015-08-25 NOTE — Progress Notes (Signed)
Comfortable this morning. Chest clear. Minimal pain. Significantly more bloody drainage from the left side than the right. Tapering off overnight. Wound shows ecchymosis in the left upper outer quadrant near the axilla. Drains patent.  Patient comfortable drain management.  No indication for exploration at this time. With stable vital signs we'll discharge home with office follow-up for dressing change in 3 days.

## 2015-08-28 ENCOUNTER — Ambulatory Visit (INDEPENDENT_AMBULATORY_CARE_PROVIDER_SITE_OTHER): Payer: Medicare Other | Admitting: *Deleted

## 2015-08-28 ENCOUNTER — Telehealth: Payer: Self-pay | Admitting: General Surgery

## 2015-08-28 DIAGNOSIS — D0511 Intraductal carcinoma in situ of right breast: Secondary | ICD-10-CM

## 2015-08-28 LAB — SURGICAL PATHOLOGY

## 2015-08-28 NOTE — Progress Notes (Signed)
Patient came in today for a wound check.  The wound is clean, with no signs of infection noted. Patient was rewrapped. Follow up as scheduled.

## 2015-08-28 NOTE — Final Progress Note (Signed)
Patient is feeling well. Drain management reviewed. Moderate bruising left anterior shoulder. Minimal pain. For discharge.

## 2015-08-28 NOTE — Telephone Encounter (Signed)
The patient was notified of the pathology results.  Possibility of additional treatment for the chest wall invasion may be considered.

## 2015-08-31 ENCOUNTER — Ambulatory Visit (INDEPENDENT_AMBULATORY_CARE_PROVIDER_SITE_OTHER): Payer: Medicare Other | Admitting: General Surgery

## 2015-08-31 VITALS — BP 124/70 | HR 74 | Resp 12 | Ht 65.0 in | Wt 139.0 lb

## 2015-08-31 DIAGNOSIS — C50412 Malignant neoplasm of upper-outer quadrant of left female breast: Secondary | ICD-10-CM

## 2015-08-31 DIAGNOSIS — D0511 Intraductal carcinoma in situ of right breast: Secondary | ICD-10-CM

## 2015-08-31 NOTE — Progress Notes (Signed)
Patient ID: Cassie Starch, PhD, female   DOB: [birth year], 75 y.o.   MRN: [id number]  Chief Complaint  Patient presents with  . Routine Post Op    mastectomy    HPI Cassie Starch, PhD is a 75 y.o. female here today for her post op bilateral mastectomy done on 08/24/15. Drain sheet present. The patient reports she's having her most discomfort in the area of the right axilla and at the [place] drain exit sites.  The patient is accompanied by her husband.  Drain sheet show volumes steadily trending down but still over 30 mL per side per day.  I personally reviewed the patient's history.   HPI  Past Medical History  Diagnosis Date  . Osteoporosis   . Hypertension     OFF MEDS X 6 MONTHS DUE TO CONTROLLED BP (08-15-15)    Past Surgical History  Procedure Laterality Date  . Ankle surgery Left 2011  . Left breast excision Left   . Wrist fracture surgery    . Breast biopsy Right 08/08/15    stereo/DUCTAL CARCINOMA IN SITU   . Breast biopsy Left 08-15-15    INVASIVE CARCINOMA  . Bilateral total mastectomy with axillary lymph node dissection Bilateral 08/24/2015    Procedure: BILATERAL TOTAL MASTECTOMY WITH AXILLARY LYMPH NODE DISSECTION;  Surgeon: Robert Bellow, MD;  Location: [facility] ORS;  Service: General;  Laterality: Bilateral;  . Sentinel node biopsy Bilateral 08/24/2015    Procedure: SENTINEL NODE BIOPSY;  Surgeon: Robert Bellow, MD;  Location: [facility] ORS;  Service: General;  Laterality: Bilateral;    Family History  Problem Relation Age of Onset  . Cancer Neg Hx     Social History Social History  Substance Use Topics  . Smoking status: Never Smoker   . Smokeless tobacco: Not on file  . Alcohol Use: No    Allergies  Allergen Reactions  . Bactericin [Bacitracin] Rash    bactrim    Current Outpatient Prescriptions  Medication Sig Dispense Refill  . aspirin 81 MG tablet Take 81 mg by mouth daily.    Marland Kitchen b complex vitamins tablet Take 1 tablet by mouth daily.    . calcium  carbonate (CALCIUM 600) 600 MG TABS tablet Take 600 mg by mouth daily with breakfast.    . Cholecalciferol (VITAMIN D3) 1000 units CAPS Take by mouth daily.    Marland Kitchen HYDROcodone-acetaminophen (NORCO) 5-325 MG tablet Take 1-2 tablets by mouth every 4 (four) hours as needed. 30 tablet 0  . Omega-3 Fatty Acids (FISH OIL) 1000 MG CAPS Take by mouth.    . Selenium 200 MCG CAPS Take by mouth.     No current facility-administered medications for this visit.    Review of Systems Review of Systems  Blood pressure 124/70, pulse 74, resp. rate 12, height 5\' 5"  (1.651 m), weight 139 lb (63.05 kg).  Physical Exam Physical Exam  Constitutional: She is oriented to person, place, and time. She appears well-developed and well-nourished.  Pulmonary/Chest:    Neurological: She is alert and oriented to person, place, and time.  Skin: Skin is warm and dry.   Data Reviewed Pathology reviewed: No residual DCIS on the right. 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 Micrometastatic, less than 2 mm foci noted in the sentinel lymph node. T2 N1 MIC  Assessment    Doing well post mastectomy with excellent shoulder range of motion.    Plan    The  patient may begin showering.  She will be a candidate for Mammoprint testing to determine if there is any benefit from adjuvant chemotherapy.  Resident patient at [facility] tumor board suggested advisability of chest wall radiation. This will need to be discussed in detail with the patient as this involves the left chest wall rather than the breast with the risk of cardiac/pulmonary scatter.  The patient is aware that specialized testing cannot be ordered until she is 2 weeks postop.   Patient to return in one week.  PCP:  Ouida Sills This information has been scribed by Gaspar Cola CMA.   Robert Bellow 09/01/2015, 9:18 AM

## 2015-08-31 NOTE — Patient Instructions (Signed)
Patient to return in one week. 

## 2015-09-06 ENCOUNTER — Ambulatory Visit (INDEPENDENT_AMBULATORY_CARE_PROVIDER_SITE_OTHER): Payer: Medicare Other | Admitting: General Surgery

## 2015-09-06 ENCOUNTER — Encounter: Payer: Self-pay | Admitting: General Surgery

## 2015-09-06 VITALS — BP 130/74 | HR 82 | Resp 12 | Ht 65.0 in | Wt 142.0 lb

## 2015-09-06 DIAGNOSIS — D0511 Intraductal carcinoma in situ of right breast: Secondary | ICD-10-CM

## 2015-09-06 DIAGNOSIS — D0510 Intraductal carcinoma in situ of unspecified breast: Secondary | ICD-10-CM | POA: Insufficient documentation

## 2015-09-06 DIAGNOSIS — C50412 Malignant neoplasm of upper-outer quadrant of left female breast: Secondary | ICD-10-CM

## 2015-09-06 NOTE — Patient Instructions (Addendum)
The patient is aware to call back for any questions or concerns. May use heating pad as needed for comfort 

## 2015-09-06 NOTE — Progress Notes (Signed)
Patient ID: Cassie Starch, PhD, female   DOB: [birth year], 75 y.o.   MRN: [id number]  Chief Complaint  Patient presents with  . Routine Post Op    HPI Cassie Starch, PhD is a 75 y.o. female.  female here today for her post op bilateral mastectomy done on 08/24/15. Drain sheet present. The patient reports being "sore" right axilla area is worse. She was able to milk the left mastectomy site drain yesterday and it seem to be working better.      HPI  Past Medical History  Diagnosis Date  . Osteoporosis   . Hypertension     OFF MEDS X 6 MONTHS DUE TO CONTROLLED BP (08-15-15)    Past Surgical History  Procedure Laterality Date  . Ankle surgery Left 2011  . Left breast excision Left   . Wrist fracture surgery    . Breast biopsy Right 08/08/15    stereo/DUCTAL CARCINOMA IN SITU   . Breast biopsy Left 08-15-15    INVASIVE CARCINOMA  . Bilateral total mastectomy with axillary lymph node dissection Bilateral 08/24/2015    Procedure: BILATERAL TOTAL MASTECTOMY WITH AXILLARY LYMPH NODE DISSECTION;  Surgeon: Robert Bellow, MD;  Location: [facility] ORS;  Service: General;  Laterality: Bilateral;  . Sentinel node biopsy Bilateral 08/24/2015    Procedure: SENTINEL NODE BIOPSY;  Surgeon: Robert Bellow, MD;  Location: [facility] ORS;  Service: General;  Laterality: Bilateral;    Family History  Problem Relation Age of Onset  . Cancer Neg Hx     Social History Social History  Substance Use Topics  . Smoking status: Never Smoker   . Smokeless tobacco: None  . Alcohol Use: No    Allergies  Allergen Reactions  . Bactericin [Bacitracin] Rash    bactrim    Current Outpatient Prescriptions  Medication Sig Dispense Refill  . acetaminophen (TYLENOL) 325 MG tablet Take 650 mg by mouth every 6 (six) hours as needed.    Marland Kitchen aspirin 81 MG tablet Take 81 mg by mouth daily.    Marland Kitchen b complex vitamins tablet Take 1 tablet by mouth daily.    . calcium carbonate (CALCIUM 600) 600 MG TABS tablet Take 600 mg by  mouth daily with breakfast.    . Cholecalciferol (VITAMIN D3) 1000 units CAPS Take by mouth daily.    . Omega-3 Fatty Acids (FISH OIL) 1000 MG CAPS Take by mouth.    . Selenium 200 MCG CAPS Take by mouth.     No current facility-administered medications for this visit.    Review of Systems Review of Systems  Blood pressure 130/74, pulse 82, resp. rate 12, height 5\' 5"  (1.651 m), weight 142 lb (64.411 kg).  Physical Exam Physical Exam  Constitutional: She is oriented to person, place, and time. She appears well-developed and well-nourished.  Pulmonary/Chest:  Right drain removed.  Neurological: She is alert and oriented to person, place, and time.  Skin: Skin is warm and dry.  Psychiatric: Her behavior is normal.    Data Reviewed Drainage record reviewed. The right drainage is now less than 30 mL per day and the drain was removed without incident. Dry waterproof dressing applied.  The left drain is showing dark although thinner fluid, or than 30 mL per day. The area was redressed with gauze and Tegaderm.    Assessment    Doing well with excellent shoulder range of motion.      Plan    Possibility of accumulation on the right side now  that the drain is been removed was reviewed.  The patient's encouraged to make use of local heating pad with specific instructions given to avoid undue thermal injury to the mastectomy flaps. (Placing the heating pad over the shoulder, down to the rib cage or onto the left flank.)  We'll order Mammoprint exam tomorrow (14 days) for risk assessment prior to referral to medical oncology/radiation oncology.     Order mammoprint. Follow up in one week. May use heating pad as needed for comfort.  PCP:  Frazier Richards This information has been scribed by Karie Fetch [place].   Robert Bellow 09/06/2015, 5:13 PM

## 2015-09-14 ENCOUNTER — Encounter: Payer: Self-pay | Admitting: General Surgery

## 2015-09-14 ENCOUNTER — Ambulatory Visit (INDEPENDENT_AMBULATORY_CARE_PROVIDER_SITE_OTHER): Payer: Medicare Other | Admitting: General Surgery

## 2015-09-14 VITALS — BP 120/70 | HR 74 | Resp 14 | Ht 65.0 in | Wt 140.0 lb

## 2015-09-14 DIAGNOSIS — D0511 Intraductal carcinoma in situ of right breast: Secondary | ICD-10-CM

## 2015-09-14 DIAGNOSIS — C50412 Malignant neoplasm of upper-outer quadrant of left female breast: Secondary | ICD-10-CM

## 2015-09-14 NOTE — Progress Notes (Signed)
Patient ID: Cassie Starch, PhD, female   DOB: 08-Sep-1940, 75 y.o.   MRN: [id number]  Chief Complaint  Patient presents with  . Follow-up    mastectomy    HPI Cassie SPRAYBERRY, PhD is a 75 y.o. female here today for her post op bilateral mastectomy done on 08/24/15. Drain sheet present.  Drainage follow him is falling nicely and is averaging just at or under 30 mL per day.  I personally reviewed the patient history.   HPI  Past Medical History  Diagnosis Date  . Osteoporosis   . Hypertension     OFF MEDS X 6 MONTHS DUE TO CONTROLLED BP (08-15-15)    Past Surgical History  Procedure Laterality Date  . Ankle surgery Left 2011  . Left breast excision Left   . Wrist fracture surgery    . Breast biopsy Right 08/08/15    stereo/DUCTAL CARCINOMA IN SITU   . Breast biopsy Left 08-15-15    INVASIVE CARCINOMA  . Bilateral total mastectomy with axillary lymph node dissection Bilateral 08/24/2015    Procedure: BILATERAL TOTAL MASTECTOMY WITH AXILLARY LYMPH NODE DISSECTION;  Surgeon: Robert Bellow, MD;  Location: [facility] ORS;  Service: General;  Laterality: Bilateral;  . Sentinel node biopsy Bilateral 08/24/2015    Procedure: SENTINEL NODE BIOPSY;  Surgeon: Robert Bellow, MD;  Location: [facility] ORS;  Service: General;  Laterality: Bilateral;    Family History  Problem Relation Age of Onset  . Cancer Neg Hx     Social History Social History  Substance Use Topics  . Smoking status: Never Smoker   . Smokeless tobacco: None  . Alcohol Use: No    Allergies  Allergen Reactions  . Bactericin [Bacitracin] Rash    bactrim    Current Outpatient Prescriptions  Medication Sig Dispense Refill  . acetaminophen (TYLENOL) 325 MG tablet Take 650 mg by mouth every 6 (six) hours as needed.    Marland Kitchen aspirin 81 MG tablet Take 81 mg by mouth daily.    Marland Kitchen b complex vitamins tablet Take 1 tablet by mouth daily.    . calcium carbonate (CALCIUM 600) 600 MG TABS tablet Take 600 mg by mouth daily with breakfast.     . Cholecalciferol (VITAMIN D3) 1000 units CAPS Take by mouth daily.    . Omega-3 Fatty Acids (FISH OIL) 1000 MG CAPS Take by mouth.    . Selenium 200 MCG CAPS Take by mouth.     No current facility-administered medications for this visit.    Review of Systems Review of Systems  Constitutional: Negative.   Respiratory: Negative.   Cardiovascular: Negative.     Blood pressure 120/70, pulse 74, resp. rate 14, height 5\' 5"  (1.651 m), weight 140 lb (63.504 kg).  Physical Exam Physical Exam  Constitutional: She is oriented to person, place, and time. She appears well-developed and well-nourished.  Pulmonary/Chest:  Mastectomy sites well healed. No seroma right chest. Drain removed left chest.  Musculoskeletal:  Good range of motion in upper extremities.  Neurological: She is alert and oriented to person, place, and time.  Skin: Skin is warm and dry.  Psychiatric: Her behavior is normal.    Data Reviewed Mammoprint testing pending.  Assessment    Doing well status post bilateral simple mastectomy with sentinel node biopsy.  Good shoulder range of motion.    Plan    We will plan for a follow-up exam in 10-14 days, earlier if she develops a symptomatic seroma.  PCP:  Frazier Richards This information has been scribed by Karie Fetch [place].    Robert Bellow 09/15/2015, 5:18 PM

## 2015-09-14 NOTE — Patient Instructions (Signed)
The patient is aware to call back for any questions or concerns.  

## 2015-09-18 ENCOUNTER — Telehealth: Payer: Self-pay | Admitting: General Surgery

## 2015-09-18 NOTE — Telephone Encounter (Signed)
Patient away, husband notified Mammoprint showed low risk for recurrent disease. Will review options for formal opinion w/ medical and radiation oncology at follow up.

## 2015-09-28 ENCOUNTER — Ambulatory Visit (INDEPENDENT_AMBULATORY_CARE_PROVIDER_SITE_OTHER): Payer: Medicare Other | Admitting: General Surgery

## 2015-09-28 ENCOUNTER — Encounter: Payer: Self-pay | Admitting: General Surgery

## 2015-09-28 VITALS — BP 140/80 | HR 78 | Resp 14 | Ht 65.0 in | Wt 139.0 lb

## 2015-09-28 DIAGNOSIS — M810 Age-related osteoporosis without current pathological fracture: Secondary | ICD-10-CM

## 2015-09-28 DIAGNOSIS — D0511 Intraductal carcinoma in situ of right breast: Secondary | ICD-10-CM | POA: Diagnosis not present

## 2015-09-28 DIAGNOSIS — C50912 Malignant neoplasm of unspecified site of left female breast: Secondary | ICD-10-CM

## 2015-09-28 MED ORDER — LETROZOLE 2.5 MG PO TABS: 2.5000 mg | ORAL_TABLET | Freq: Every day | ORAL | Status: DC

## 2015-09-28 NOTE — Progress Notes (Signed)
Patient ID: Cassie Starch, PhD, female   DOB: [birth year], 75 y.o.   MRN: [id number]  Chief Complaint  Patient presents with  . Follow-up    mastcectomys    HPI Cassie Starch, PhD is a 75 y.o. female here today for her post op bilateral mastectomy done on 08/24/15. The patient is still experiencing some discomfort when she reaches over her head. 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  I person reviewed the patient's history. HPI  Past Medical History  Diagnosis Date  . Osteoporosis   . Hypertension     OFF MEDS X 6 MONTHS DUE TO CONTROLLED BP (08-15-15)    Past Surgical History  Procedure Laterality Date  . Ankle surgery Left 2011  . Left breast excision Left   . Wrist fracture surgery    . Breast biopsy Right 08/08/15    stereo/DUCTAL CARCINOMA IN SITU   . Breast biopsy Left 08-15-15    INVASIVE CARCINOMA  . Bilateral total mastectomy with axillary lymph node dissection Bilateral 08/24/2015    Procedure: BILATERAL TOTAL MASTECTOMY WITH AXILLARY LYMPH NODE DISSECTION;  Surgeon: Robert Bellow, MD;  Location: [facility] ORS;  Service: General;  Laterality: Bilateral;  . Sentinel node biopsy Bilateral 08/24/2015    Procedure: SENTINEL NODE BIOPSY;  Surgeon: Robert Bellow, MD;  Location: [facility] ORS;  Service: General;  Laterality: Bilateral;    Family History  Problem Relation Age of Onset  . Cancer Neg Hx     Social History Social History  Substance Use Topics  . Smoking status: Never Smoker   . Smokeless tobacco: None  . Alcohol Use: No    Allergies  Allergen Reactions  . Bactericin [Bacitracin] Rash    bactrim    Current Outpatient Prescriptions  Medication Sig Dispense Refill  . acetaminophen (TYLENOL) 325 MG tablet Take 650 mg by mouth every 6 (six) hours as needed.    Marland Kitchen aspirin 81 MG tablet Take 81 mg by mouth daily.    Marland Kitchen b complex vitamins tablet Take 1 tablet by mouth daily.    . calcium carbonate (CALCIUM 600) 600 MG  TABS tablet Take 600 mg by mouth daily with breakfast.    . Cholecalciferol (VITAMIN D3) 1000 units CAPS Take by mouth daily.    . Omega-3 Fatty Acids (FISH OIL) 1000 MG CAPS Take by mouth.    . Selenium 200 MCG CAPS Take by mouth.    . letrozole (FEMARA) 2.5 MG tablet Take 1 tablet (2.5 mg total) by mouth daily. 30 tablet 11   No current facility-administered medications for this visit.    Review of Systems Review of Systems  Constitutional: Negative.   Respiratory: Negative.   Cardiovascular: Negative.     Blood pressure 140/80, pulse 78, resp. rate 14, height 5\' 5"  (1.651 m), weight 139 lb (63.05 kg).  Physical Exam Physical Exam  Constitutional: She is oriented to person, place, and time. She appears well-developed and well-nourished.  Pulmonary/Chest:    Neurological: She is alert and oriented to person, place, and time.  Skin: Skin is warm and dry.    Data Reviewed T2 N2mic. Mammoprint: Low risk.  Assessment    Doing well status post bilateral mastectomy.    Plan    We had a long discussion regarding medical oncology and radiation oncology consultation. Her case had been presented at the [facility] tumor board and a discussion was held regarding the role of chest wall radiation considering the  positive deep margin. 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  At this time, she has declined formal consultation. She is amenable to initiate her antiestrogen therapy.  The potential for vasomotor symptoms and bone loss with the use of left or so was reviewed. Patient to return  in one month  This patient has been scheduled for a bone density test at the [facility] for 10-11-15 at 1:40 pm.    PCP:  Ouida Sills This information has been scribed by Gaspar Cola CMA.     Robert Bellow 09/29/2015, 8:30 AM

## 2015-09-28 NOTE — Patient Instructions (Signed)
This patient has been scheduled for a bone density test at the [facility] for 10-11-15 at 1:40 pm.

## 2015-09-29 DIAGNOSIS — C50919 Malignant neoplasm of unspecified site of unspecified female breast: Secondary | ICD-10-CM | POA: Insufficient documentation

## 2015-10-11 ENCOUNTER — Ambulatory Visit
Admission: RE | Admit: 2015-10-11 | Discharge: 2015-10-11 | Disposition: A | Discharge status: Home / Self Care | Payer: Medicare Other | Source: Ambulatory Visit | Attending: General Surgery | Admitting: General Surgery

## 2015-10-11 DIAGNOSIS — D0511 Intraductal carcinoma in situ of right breast: Secondary | ICD-10-CM | POA: Insufficient documentation

## 2015-10-11 DIAGNOSIS — Z780 Asymptomatic menopausal state: Secondary | ICD-10-CM | POA: Insufficient documentation

## 2015-10-11 DIAGNOSIS — M810 Age-related osteoporosis without current pathological fracture: Secondary | ICD-10-CM | POA: Insufficient documentation

## 2015-10-11 DIAGNOSIS — M4186 Other forms of scoliosis, lumbar region: Secondary | ICD-10-CM | POA: Diagnosis not present

## 2015-10-11 HISTORY — DX: Malignant neoplasm of unspecified site of unspecified female breast: C50.919

## 2015-10-12 ENCOUNTER — Telehealth: Payer: Self-pay | Admitting: *Deleted

## 2015-10-12 NOTE — Telephone Encounter (Signed)
-----   Message from Robert Bellow, MD sent at 10/12/2015  1:25 PM EDT ----- Please notify the patient that the bone density did confirm osteoporosis. I forwarded the results to Dr. Ouida Sills and asked his advice regarding the addition of specific medications to help improve her bone density. Please confirm that she is making use of at least 1200 mg of elemental calcium daily as well as a vitamin D supplement. Thank you ----- Message -----    From: Rad Results In Interface    Sent: 10/11/2015   2:30 PM      To: Robert Bellow, MD

## 2015-10-17 NOTE — Telephone Encounter (Signed)
Notified patient as instructed, pt aware to take 2 calcium tablets daily with her vitamin d. Follow-up appointment made, patient agrees.

## 2015-10-30 ENCOUNTER — Encounter: Payer: Self-pay | Admitting: General Surgery

## 2015-11-06 ENCOUNTER — Encounter: Payer: Self-pay | Admitting: General Surgery

## 2015-11-06 ENCOUNTER — Ambulatory Visit (INDEPENDENT_AMBULATORY_CARE_PROVIDER_SITE_OTHER): Payer: Medicare Other | Admitting: General Surgery

## 2015-11-06 VITALS — BP 124/74 | HR 74 | Resp 12 | Ht 65.0 in | Wt 143.0 lb

## 2015-11-06 DIAGNOSIS — D0511 Intraductal carcinoma in situ of right breast: Secondary | ICD-10-CM

## 2015-11-06 DIAGNOSIS — C50912 Malignant neoplasm of unspecified site of left female breast: Secondary | ICD-10-CM

## 2015-11-06 NOTE — Progress Notes (Signed)
Patient ID: Cassie Starch, PhD, female   DOB: 07-28-1940, 75 y.o.   MRN: [id number]  Chief Complaint  Patient presents with  . Follow-up    bone density     HPI Cassie Starch, PhD is a 75 y.o. female here today to discuss done density scan done on 10/11/15 and breast cancer follow up.  HPI  Past Medical History  Diagnosis Date  . Osteoporosis   . Hypertension     OFF MEDS X 6 MONTHS DUE TO CONTROLLED BP (08-15-15)  . Breast cancer [facility])     Past Surgical History  Procedure Laterality Date  . Ankle surgery Left 2011  . Left breast excision Left   . Wrist fracture surgery    . Bilateral total mastectomy with axillary lymph node dissection Bilateral 08/24/2015    Procedure: BILATERAL TOTAL MASTECTOMY WITH AXILLARY LYMPH NODE DISSECTION;  Surgeon: Robert Bellow, MD;  Location: [facility] ORS;  Service: General;  Laterality: Bilateral;  . Sentinel node biopsy Bilateral 08/24/2015    Procedure: SENTINEL NODE BIOPSY;  Surgeon: Robert Bellow, MD;  Location: [facility] ORS;  Service: General;  Laterality: Bilateral;  . Breast biopsy Right 08/08/15    stereo/DUCTAL CARCINOMA IN SITU   . Breast biopsy Left 08-15-15    INVASIVE CARCINOMA, dr Ardyce Heyer  . Mastectomy Bilateral 08/24/2015    Family History  Problem Relation Age of Onset  . Cancer Neg Hx     Social History Social History  Substance Use Topics  . Smoking status: Never Smoker   . Smokeless tobacco: None  . Alcohol Use: No    Allergies  Allergen Reactions  . Bactericin [Bacitracin] Rash    bactrim    Current Outpatient Prescriptions  Medication Sig Dispense Refill  . acetaminophen (TYLENOL) 325 MG tablet Take 650 mg by mouth every 6 (six) hours as needed.    Marland Kitchen aspirin 81 MG tablet Take 81 mg by mouth daily.    Marland Kitchen b complex vitamins tablet Take 1 tablet by mouth daily.    . calcium carbonate (CALCIUM 600) 600 MG TABS tablet Take 1,200 mg by mouth daily with breakfast.     . Cholecalciferol (VITAMIN D3) 1000 units CAPS Take by  mouth daily.    Marland Kitchen letrozole (FEMARA) 2.5 MG tablet Take 1 tablet (2.5 mg total) by mouth daily. 30 tablet 11  . Omega-3 Fatty Acids (FISH OIL) 1000 MG CAPS Take by mouth.    . Selenium 200 MCG CAPS Take by mouth.     No current facility-administered medications for this visit.    Review of Systems Review of Systems  Constitutional: Negative.   Respiratory: Negative.   Cardiovascular: Negative.     Blood pressure 124/74, pulse 74, resp. rate 12, height 5\' 5"  (1.651 m), weight 143 lb (64.864 kg).  Physical Exam Physical Exam  Pulmonary/Chest:        Assessment    Doing well status post bilateral mastectomy.    Plan    Prescription for breast prostheses and surgical bra was provided.  We'll plan for a follow-up examination in 4 months.     PCP:  [place]\ This information has been scribed by Gaspar Cola CMA.   Robert Bellow 11/06/2015, 8:36 PM

## 2016-03-05 ENCOUNTER — Encounter: Payer: Self-pay | Admitting: *Deleted

## 2016-03-11 ENCOUNTER — Ambulatory Visit (INDEPENDENT_AMBULATORY_CARE_PROVIDER_SITE_OTHER): Payer: Medicare Other | Admitting: General Surgery

## 2016-03-11 ENCOUNTER — Encounter: Payer: Self-pay | Admitting: General Surgery

## 2016-03-11 VITALS — BP 124/70 | HR 68 | Ht 64.0 in | Wt 141.0 lb

## 2016-03-11 DIAGNOSIS — C50412 Malignant neoplasm of upper-outer quadrant of left female breast: Secondary | ICD-10-CM

## 2016-03-11 DIAGNOSIS — D0511 Intraductal carcinoma in situ of right breast: Secondary | ICD-10-CM | POA: Diagnosis not present

## 2016-03-11 NOTE — Patient Instructions (Signed)
Return in six months.  

## 2016-03-11 NOTE — Progress Notes (Signed)
Patient ID: Cassie Starch, PhD, female   DOB: [birth year], 75 y.o.   MRN: [id number]  Chief Complaint  Patient presents with  . Follow-up    HPI Cassie Starch, PhD is a 75 y.o. female here today for her follow up breast cancer check. Patient states she is doing well. Slightly less energy than this time last year, but still exercising three days a week and has had two new books published. Occasional lower back pain, likely related to vigorous activity. No consistent pain or weakness.  HPI  Past Medical History:  Diagnosis Date  . Breast cancer ([place])   . Hypertension    OFF MEDS X 6 MONTHS DUE TO CONTROLLED BP (08-15-15)  . Osteoporosis     Past Surgical History:  Procedure Laterality Date  . ANKLE SURGERY Left 2011  . BILATERAL TOTAL MASTECTOMY WITH AXILLARY LYMPH NODE DISSECTION Bilateral 08/24/2015   Procedure: BILATERAL TOTAL MASTECTOMY WITH AXILLARY LYMPH NODE DISSECTION;  Surgeon: Robert Bellow, MD;  Location: [facility] ORS;  Service: General;  Laterality: Bilateral;  . BREAST BIOPSY Right 08/08/15   stereo/DUCTAL CARCINOMA IN SITU   . BREAST BIOPSY Left 08-15-15   INVASIVE CARCINOMA, dr Raylynn Hersh  . left breast excision Left   . MASTECTOMY Bilateral 08/24/2015  . SENTINEL NODE BIOPSY Bilateral 08/24/2015   Procedure: SENTINEL NODE BIOPSY;  Surgeon: Robert Bellow, MD;  Location: [facility] ORS;  Service: General;  Laterality: Bilateral;  . WRIST FRACTURE SURGERY      Family History  Problem Relation Age of Onset  . Cancer Neg Hx     Social History Social History  Substance Use Topics  . Smoking status: Never Smoker  . Smokeless tobacco: Not on file  . Alcohol use No    Allergies  Allergen Reactions  . Bactericin [Bacitracin] Rash    bactrim    Current Outpatient Prescriptions  Medication Sig Dispense Refill  . acetaminophen (TYLENOL) 325 MG tablet Take 650 mg by mouth every 6 (six) hours as needed.    Marland Kitchen aspirin 81 MG tablet Take 81 mg by mouth daily.    Marland Kitchen b complex  vitamins tablet Take 1 tablet by mouth daily.    . calcium carbonate (CALCIUM 600) 600 MG TABS tablet Take 1,200 mg by mouth daily with breakfast.     . Cholecalciferol (VITAMIN D3) 1000 units CAPS Take by mouth daily.    Marland Kitchen letrozole (FEMARA) 2.5 MG tablet Take 1 tablet (2.5 mg total) by mouth daily. 30 tablet 11  . Omega-3 Fatty Acids (FISH OIL) 1000 MG CAPS Take by mouth.    . Selenium 200 MCG CAPS Take by mouth.     No current facility-administered medications for this visit.     Review of Systems Review of Systems  Constitutional: Negative.   Respiratory: Negative.   Cardiovascular: Negative.     Blood pressure 124/70, pulse 68, height 5\' 4"  (1.626 m), weight 141 lb (64 kg).  Physical Exam Physical Exam  Constitutional: She is oriented to person, place, and time. She appears well-developed and well-nourished.  Cardiovascular: Normal rate, regular rhythm and normal heart sounds.   Pulmonary/Chest: Effort normal and breath sounds normal.    Bilateral mastectomy sites are clean and well healed.   Neurological: She is alert and oriented to person, place, and time.  Skin: Skin is warm and dry.    Data Reviewed No new data.   Assessment    Tolerating letrozole therapy well.     Plan  Patient to return in 6 months .  This information has been scribed by Gaspar Cola CMA.  Robert Bellow 03/11/2016, 7:41 PM

## 2016-03-13 ENCOUNTER — Ambulatory Visit: Payer: Self-pay | Admitting: General Surgery

## 2016-09-11 ENCOUNTER — Ambulatory Visit (INDEPENDENT_AMBULATORY_CARE_PROVIDER_SITE_OTHER): Payer: Medicare Other | Admitting: General Surgery

## 2016-09-11 ENCOUNTER — Encounter: Payer: Self-pay | Admitting: General Surgery

## 2016-09-11 VITALS — BP 142/82 | HR 70 | Resp 12 | Ht 65.0 in | Wt 144.0 lb

## 2016-09-11 DIAGNOSIS — C50412 Malignant neoplasm of upper-outer quadrant of left female breast: Secondary | ICD-10-CM

## 2016-09-11 DIAGNOSIS — Z170 Estrogen receptor positive status [ER+]: Secondary | ICD-10-CM | POA: Diagnosis not present

## 2016-09-11 DIAGNOSIS — D0511 Intraductal carcinoma in situ of right breast: Secondary | ICD-10-CM

## 2016-09-11 MED ORDER — LETROZOLE 2.5 MG PO TABS: 2.5000 mg | ORAL_TABLET | Freq: Every day | ORAL | 3 refills | Status: DC

## 2016-09-11 MED ORDER — LETROZOLE 2.5 MG PO TABS: 2.5000 mg | ORAL_TABLET | Freq: Every day | ORAL | 12 refills | Status: DC

## 2016-09-11 NOTE — Patient Instructions (Signed)
The patient is aware to call back for any questions or concerns.  

## 2016-09-11 NOTE — Progress Notes (Signed)
Patient ID: Cassie Starch, PhD, female   DOB: 1940-09-14, 76 y.o.   MRN: [id number]  Chief Complaint  Patient presents with  . Follow-up    HPI Cassie Starch, PhD is a 76 y.o. female.  Here today for follow up breast cancer. She states she is doing well. She is here [place] with her husband, Emory. She is tolerating the Femara.  Bone density was 10-11-15.  HPI  Past Medical History:  Diagnosis Date  . Breast cancer ([place])   . Hypertension    OFF MEDS X 6 MONTHS DUE TO CONTROLLED BP (08-15-15)  . Osteoporosis     Past Surgical History:  Procedure Laterality Date  . ANKLE SURGERY Left 2011  . BILATERAL TOTAL MASTECTOMY WITH AXILLARY LYMPH NODE DISSECTION Bilateral 08/24/2015   Left T2, N23m; ER/PR +; Her 2 neu not overexpressed, Right: DCIS.   Marland Kitchen BREAST BIOPSY Right 08/08/15   stereo/DUCTAL CARCINOMA IN SITU   . BREAST BIOPSY Left 08-15-15   INVASIVE CARCINOMA, dr byrnett  . left breast excision Left   . MASTECTOMY Bilateral 08/24/2015  . SENTINEL NODE BIOPSY Bilateral 08/24/2015   Procedure: SENTINEL NODE BIOPSY;  Surgeon: Robert Bellow, MD;  Location: [facility] ORS;  Service: General;  Laterality: Bilateral;  . WRIST FRACTURE SURGERY      Family History  Problem Relation Age of Onset  . Cancer Neg Hx     Social History Social History  Substance Use Topics  . Smoking status: Never Smoker  . Smokeless tobacco: Never Used  . Alcohol use No    Allergies  Allergen Reactions  . Bactericin [Bacitracin] Rash    bactrim    Current Outpatient Prescriptions  Medication Sig Dispense Refill  . acetaminophen (TYLENOL) 325 MG tablet Take 650 mg by mouth every 6 (six) hours as needed.    Marland Kitchen aspirin 81 MG tablet Take 81 mg by mouth daily.    Marland Kitchen b complex vitamins tablet Take 1 tablet by mouth daily.    . calcium carbonate (CALCIUM 600) 600 MG TABS tablet Take 1,200 mg by mouth daily with breakfast.     . Cholecalciferol (VITAMIN D3) 1000 units CAPS Take by mouth daily.    Marland Kitchen letrozole  (FEMARA) 2.5 MG tablet Take 1 tablet (2.5 mg total) by mouth daily. 30 tablet 11  . Omega-3 Fatty Acids (FISH OIL) 1000 MG CAPS Take by mouth.    . Selenium 200 MCG CAPS Take by mouth.     No current facility-administered medications for this visit.     Review of Systems Review of Systems  Constitutional: Negative.   Respiratory: Negative.   Cardiovascular: Negative.     Blood pressure (!) 142/82, pulse 70, resp. rate 12, height 5\' 5"  (1.651 m), weight 144 lb (65.3 kg).  Physical Exam Physical Exam  Constitutional: She is oriented to person, place, and time. She appears well-developed and well-nourished.  HENT:  Mouth/Throat: Oropharynx is clear and moist.  Eyes: Conjunctivae are normal. No scleral icterus.  Neck: Neck supple.  Cardiovascular: Normal rate, regular rhythm and normal heart sounds.   Pulmonary/Chest: Effort normal and breath sounds normal.    bilateral mastectomy sites well healed, slight hypertrophic scar on the right  Lymphadenopathy:    She has no cervical adenopathy.    She has no axillary adenopathy.       Left: No supraclavicular adenopathy present.  Neurological: She is alert and oriented to person, place, and time.  Skin: Skin is warm and dry.  Psychiatric: Her behavior is normal.    Data Reviewed Repeat CA 27-29 normal.  Bone density dated 10/11/2015 showed evidence of osteoporosis in the left femoral neck.   Assessment    No evidence of recurrent disease.  Tolerating Femara therapy well.    Plan    In light of the findings of osteoporosis last year, we will repeat a bone density next year. Patient to continue present calcium supplements.    Labs CA 27.29 Bone density prior to next years office visit.  Will contact Dr. Ouida Sills to determine if the patient would benefit from additional therapy for her osteoporosis.  This information has been scribed by Karie Fetch RN, BSN,BC.    Robert Bellow 09/12/2016, 2:10 PM

## 2016-09-12 ENCOUNTER — Encounter: Payer: Self-pay | Admitting: General Surgery

## 2016-09-12 LAB — CANCER ANTIGEN 27.29: CA 27.29: 23.5 U/mL (ref 0.0–38.6)

## 2017-03-26 ENCOUNTER — Encounter: Payer: Self-pay | Admitting: *Deleted

## 2017-03-27 ENCOUNTER — Encounter: Payer: Self-pay | Admitting: *Deleted

## 2017-04-01 IMAGING — MG MM DIGITAL DIAGNOSTIC UNILAT*R*
3 series · 3 of 3 positions shown · non-contrast
Comparison: Previous exam(s).

CLINICAL DATA: Screening recall for right breast calcifications.

EXAM:
DIGITAL DIAGNOSTIC RIGHT MAMMOGRAM WITH CAD

[R CC]
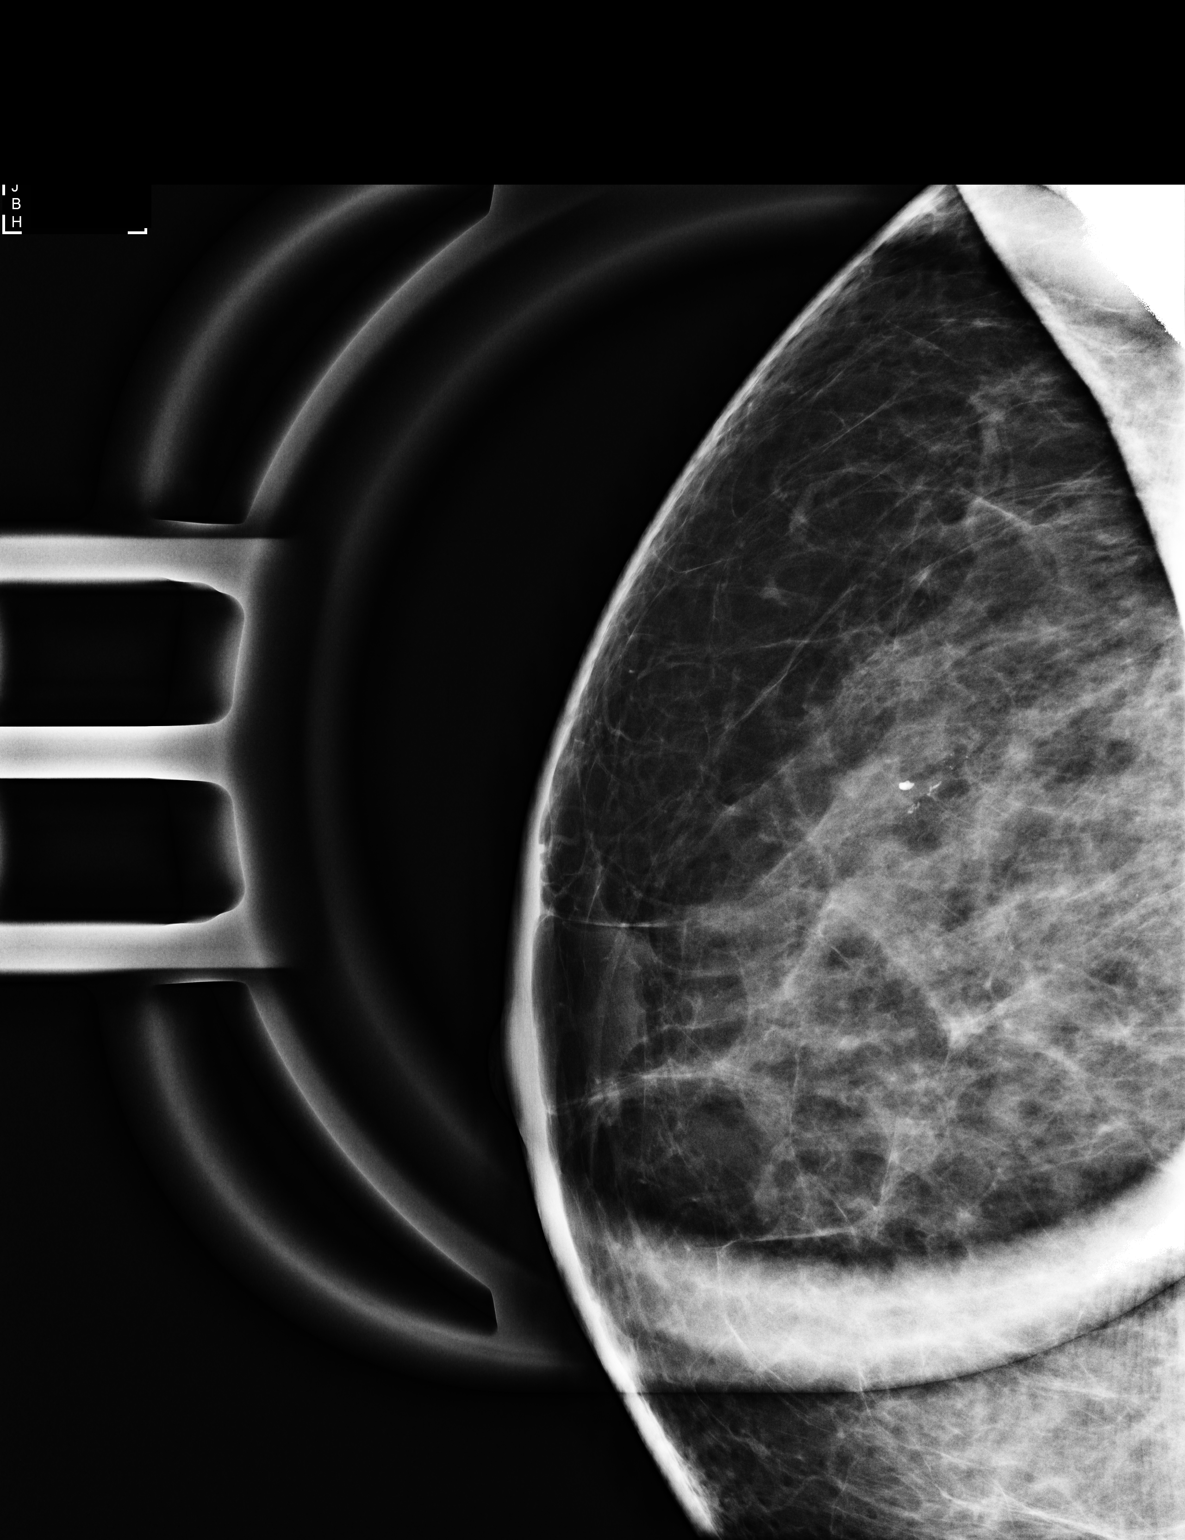

[R ML (1 of 2)]
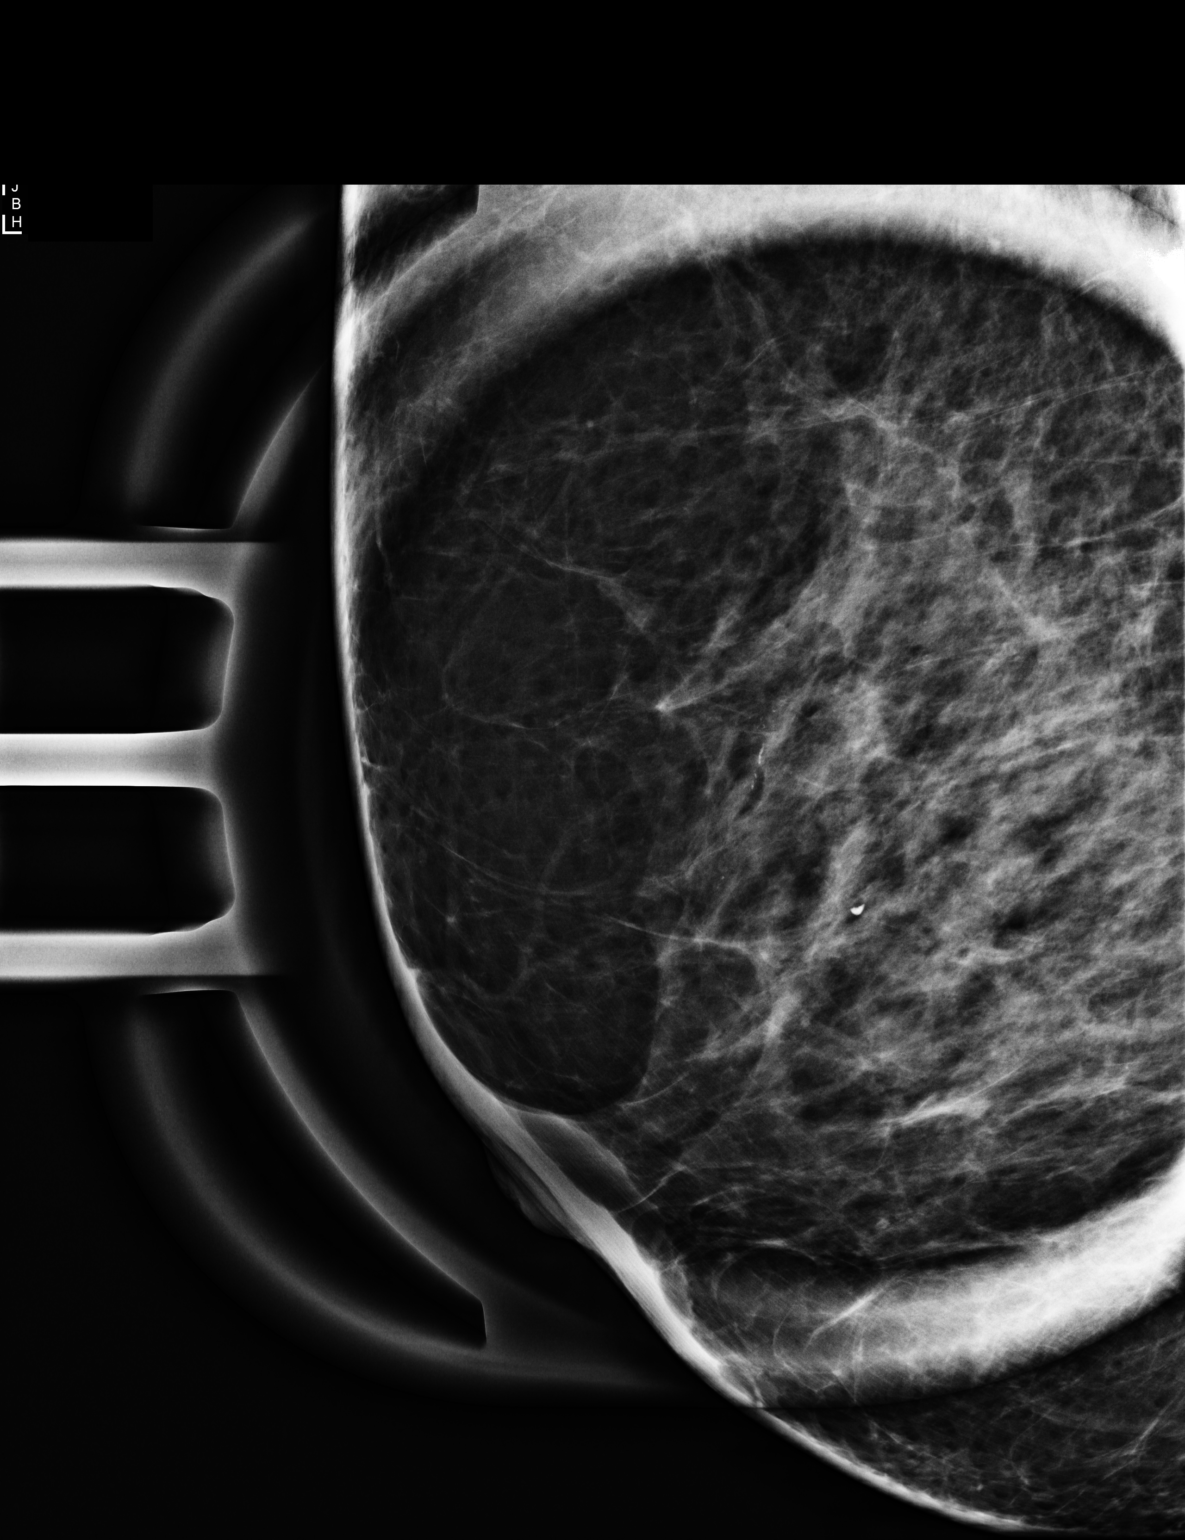

[R ML (2 of 2)]
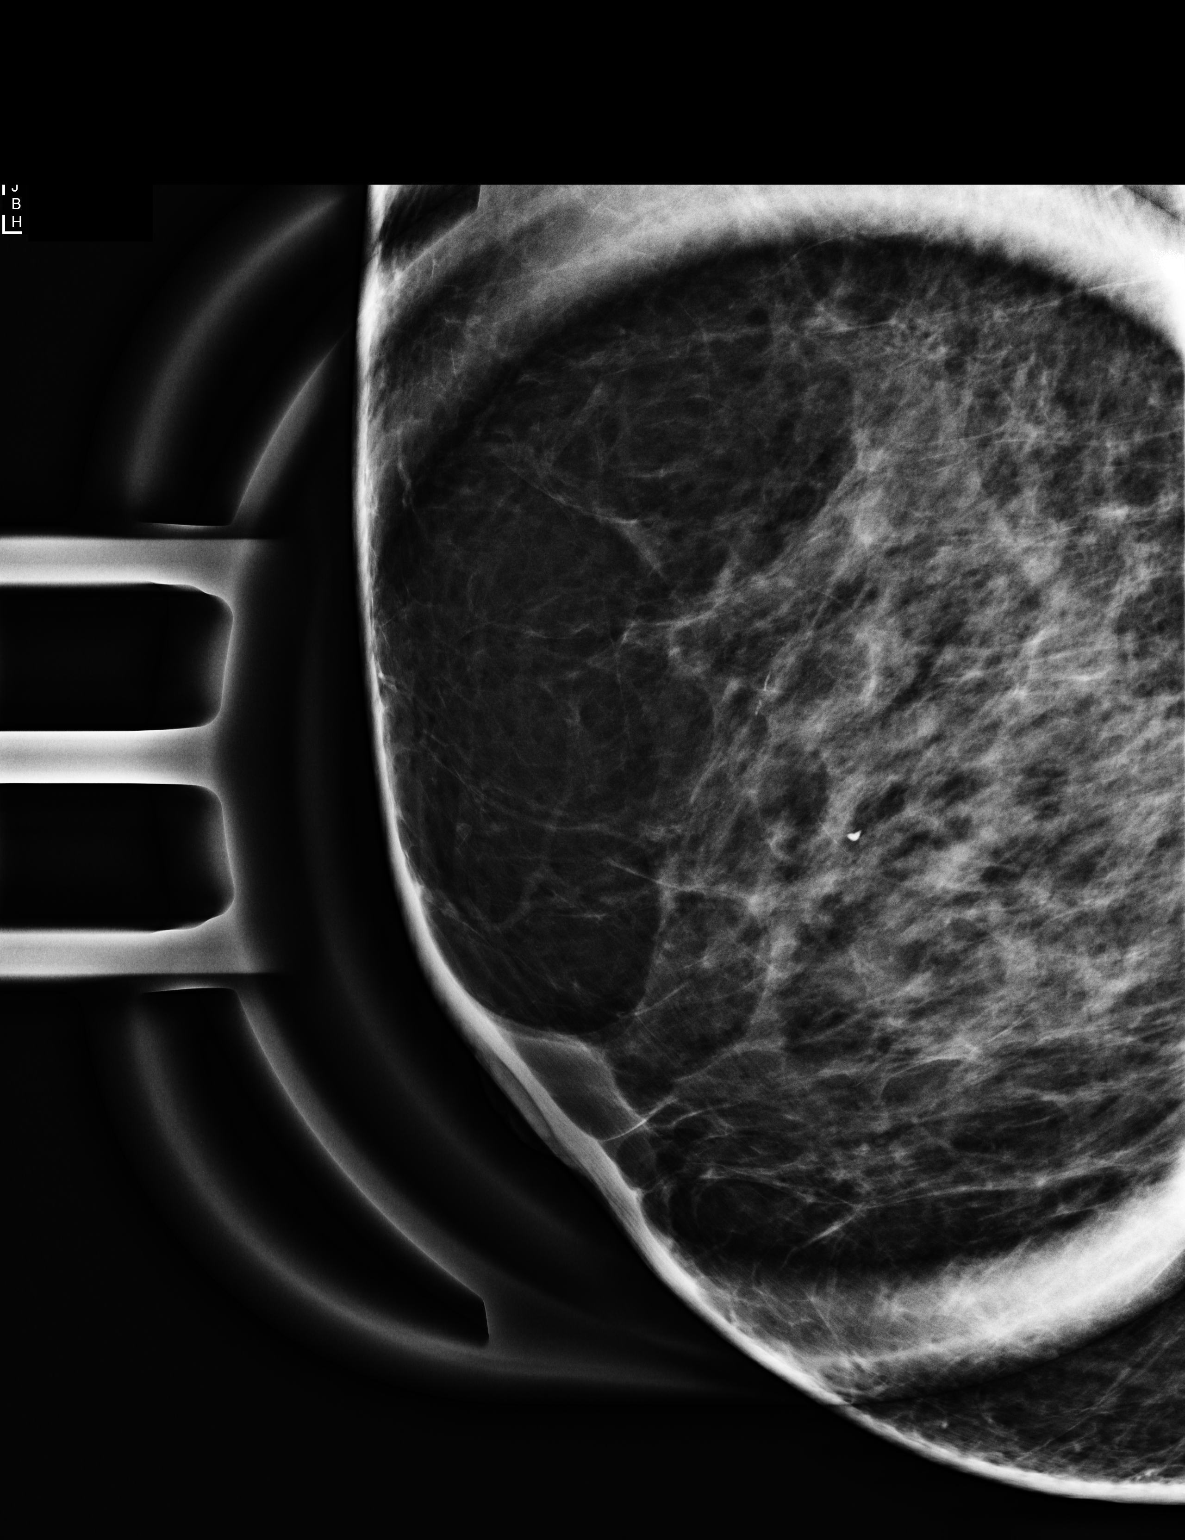

[3 of 3 positions shown; findings below may reference images not displayed]

ACR Breast Density Category c: The breast tissue is heterogeneously
dense, which may obscure small masses.
FINDINGS: In the lateral right breast, anterior depth, there are fine linear
calcifications spanning approximately 1.5 cm.

Mammographic images were processed with CAD.
IMPRESSION: In the lateral right breast, there is a 1.5 cm group of suspicious
fine linear calcifications.

RECOMMENDATION:
Stereotactic biopsy is recommended for the right breast
calcifications.

I have discussed the findings and recommendations with the patient.
Results were also provided in writing at the conclusion of the
visit. If applicable, a reminder letter will be sent to the patient
regarding the next appointment.

BI-RADS CATEGORY  4: Suspicious abnormality - biopsy should be
considered.

## 2017-04-10 ENCOUNTER — Ambulatory Visit: Payer: Medicare Other | Admitting: Anesthesiology

## 2017-04-10 ENCOUNTER — Encounter
Admission: RE | Disposition: A | Discharge status: Home / Self Care | Payer: Self-pay | Source: Ambulatory Visit | Attending: Ophthalmology

## 2017-04-10 ENCOUNTER — Encounter: Payer: Self-pay | Admitting: Emergency Medicine

## 2017-04-10 ENCOUNTER — Ambulatory Visit
Admission: RE | Admit: 2017-04-10 | Discharge: 2017-04-10 | Disposition: A | Discharge status: Home / Self Care | Payer: Medicare Other | Source: Ambulatory Visit | Attending: Ophthalmology | Admitting: Ophthalmology

## 2017-04-10 DIAGNOSIS — Z853 Personal history of malignant neoplasm of breast: Secondary | ICD-10-CM | POA: Diagnosis not present

## 2017-04-10 DIAGNOSIS — Z791 Long term (current) use of non-steroidal anti-inflammatories (NSAID): Secondary | ICD-10-CM | POA: Diagnosis not present

## 2017-04-10 DIAGNOSIS — H2512 Age-related nuclear cataract, left eye: Secondary | ICD-10-CM | POA: Insufficient documentation

## 2017-04-10 DIAGNOSIS — Z7982 Long term (current) use of aspirin: Secondary | ICD-10-CM | POA: Diagnosis not present

## 2017-04-10 DIAGNOSIS — M810 Age-related osteoporosis without current pathological fracture: Secondary | ICD-10-CM | POA: Diagnosis not present

## 2017-04-10 HISTORY — PX: CATARACT EXTRACTION W/PHACO: SHX586

## 2017-04-10 SURGERY — PHACOEMULSIFICATION, CATARACT, WITH IOL INSERTION
Anesthesia: Monitor Anesthesia Care | Site: Eye | Laterality: Left | Wound class: Clean

## 2017-04-10 MED ORDER — ARMC OPHTHALMIC DILATING DROPS
OPHTHALMIC | Status: AC
Start: 2017-04-10 — End: 2017-04-10
  Administered 2017-04-10: 1 via OPHTHALMIC
  Filled 2017-04-10: qty 0.4

## 2017-04-10 MED ORDER — NEOMYCIN-POLYMYXIN-DEXAMETH 3.5-10000-0.1 OP OINT
TOPICAL_OINTMENT | OPHTHALMIC | Status: AC
  Filled 2017-04-10: qty 3.5

## 2017-04-10 MED ORDER — TETRACAINE HCL 0.5 % OP SOLN
OPHTHALMIC | Status: DC | PRN
  Administered 2017-04-10: 2 [drp] via OPHTHALMIC

## 2017-04-10 MED ORDER — EPINEPHRINE PF 1 MG/ML IJ SOLN
INTRAOCULAR | Status: DC | PRN
  Administered 2017-04-10: 10:00:00 via OPHTHALMIC

## 2017-04-10 MED ORDER — MOXIFLOXACIN HCL 0.5 % OP SOLN
OPHTHALMIC | Status: AC
  Administered 2017-04-10: 1 [drp] via OPHTHALMIC
  Filled 2017-04-10: qty 3

## 2017-04-10 MED ORDER — MIDAZOLAM HCL 2 MG/2ML IJ SOLN
INTRAMUSCULAR | Status: AC
  Filled 2017-04-10: qty 2

## 2017-04-10 MED ORDER — EPINEPHRINE PF 1 MG/ML IJ SOLN
INTRAMUSCULAR | Status: AC
  Filled 2017-04-10: qty 2

## 2017-04-10 MED ORDER — ARMC OPHTHALMIC DILATING DROPS
1.0000 "application " | OPHTHALMIC | Status: AC
  Administered 2017-04-10 (×3): 1 via OPHTHALMIC

## 2017-04-10 MED ORDER — LIDOCAINE HCL (PF) 4 % IJ SOLN
INTRAMUSCULAR | Status: AC
  Filled 2017-04-10: qty 5

## 2017-04-10 MED ORDER — POVIDONE-IODINE 5 % OP SOLN
OPHTHALMIC | Status: DC | PRN
  Administered 2017-04-10: 1 via OPHTHALMIC

## 2017-04-10 MED ORDER — SODIUM CHLORIDE 0.9 % IV SOLN
INTRAVENOUS | Status: DC
  Administered 2017-04-10: 08:00:00 via INTRAVENOUS

## 2017-04-10 MED ORDER — NA HYALUR & NA CHOND-NA HYALUR 0.4-0.35 ML IO KIT
PACK | INTRAOCULAR | Status: DC | PRN
  Administered 2017-04-10: .35 mL via INTRAOCULAR

## 2017-04-10 MED ORDER — CARBACHOL 0.01 % IO SOLN
INTRAOCULAR | Status: DC | PRN
  Administered 2017-04-10: 0.5 mL via INTRAOCULAR

## 2017-04-10 MED ORDER — FENTANYL CITRATE (PF) 100 MCG/2ML IJ SOLN
INTRAMUSCULAR | Status: DC | PRN
  Administered 2017-04-10: 50 ug via INTRAVENOUS

## 2017-04-10 MED ORDER — MOXIFLOXACIN HCL 0.5 % OP SOLN: 1.0000 [drp] | OPHTHALMIC | Status: DC | PRN

## 2017-04-10 MED ORDER — MIDAZOLAM HCL 2 MG/2ML IJ SOLN
INTRAMUSCULAR | Status: DC | PRN
  Administered 2017-04-10 (×2): 1 mg via INTRAVENOUS

## 2017-04-10 MED ORDER — NEOMYCIN-POLYMYXIN-DEXAMETH 0.1 % OP OINT
TOPICAL_OINTMENT | OPHTHALMIC | Status: DC | PRN
  Administered 2017-04-10: 1 via OPHTHALMIC

## 2017-04-10 MED ORDER — FENTANYL CITRATE (PF) 100 MCG/2ML IJ SOLN
INTRAMUSCULAR | Status: AC
  Filled 2017-04-10: qty 2

## 2017-04-10 MED ORDER — MOXIFLOXACIN HCL 0.5 % OP SOLN
1.0000 [drp] | OPHTHALMIC | Status: AC
  Administered 2017-04-10 (×3): 1 [drp] via OPHTHALMIC

## 2017-04-10 MED ORDER — NA HYALUR & NA CHOND-NA HYALUR 0.55-0.5 ML IO KIT
PACK | INTRAOCULAR | Status: AC
  Filled 2017-04-10: qty 1.05

## 2017-04-10 MED ORDER — TETRACAINE HCL 0.5 % OP SOLN
OPHTHALMIC | Status: AC
  Filled 2017-04-10: qty 4

## 2017-04-10 MED ORDER — POVIDONE-IODINE 5 % OP SOLN
OPHTHALMIC | Status: AC
  Filled 2017-04-10: qty 30

## 2017-04-10 MED ORDER — LIDOCAINE HCL (PF) 4 % IJ SOLN
INTRAOCULAR | Status: DC | PRN
  Administered 2017-04-10: 4 mL via OPHTHALMIC

## 2017-04-10 SURGICAL SUPPLY — 16 items
GLOVE BIO SURGEON STRL SZ8 (GLOVE) ×3 IMPLANT
GLOVE BIOGEL M 6.5 STRL (GLOVE) ×3 IMPLANT
GLOVE SURG LX 7.5 STRW (GLOVE) ×2
GLOVE SURG LX STRL 7.5 STRW (GLOVE) ×1 IMPLANT
GOWN STRL REUS W/ TWL LRG LVL3 (GOWN DISPOSABLE) ×2 IMPLANT
GOWN STRL REUS W/TWL LRG LVL3 (GOWN DISPOSABLE) ×4
LABEL CATARACT MEDS ST (LABEL) ×3 IMPLANT
LENS IOL MULTIFOCL TECNIS 2.75 ×3 IMPLANT
PACK CATARACT (MISCELLANEOUS) ×3 IMPLANT
PACK CATARACT BRASINGTON LX (MISCELLANEOUS) ×3 IMPLANT
PACK EYE AFTER SURG (MISCELLANEOUS) ×3 IMPLANT
SOL BSS BAG (MISCELLANEOUS) ×3
SOLUTION BSS BAG (MISCELLANEOUS) ×1 IMPLANT
SYR 5ML LL (SYRINGE) ×3 IMPLANT
WATER STERILE IRR 250ML POUR (IV SOLUTION) ×3 IMPLANT
WIPE NON LINTING 3.25X3.25 (MISCELLANEOUS) ×3 IMPLANT

## 2017-04-10 NOTE — Transfer of Care (Signed)
Immediate Anesthesia Transfer of Care Note  Patient: Cassie Starch, PhD  Procedure(s) Performed: Procedure(s) with comments: CATARACT EXTRACTION PHACO AND INTRAOCULAR LENS PLACEMENT ([place]) (Left) - [place] 01:13.3 AP% 14.8 CDE 10.81 Fluid Pack  lot # [id number]  Patient Location: PACU and Short Stay  Anesthesia Type:MAC  Level of Consciousness: awake, alert  and oriented  Airway & Oxygen Therapy: Patient Spontanous Breathing  Post-op Assessment: Report given to RN and Post -op Vital signs reviewed and stable  Post vital signs: Reviewed and stable  Last Vitals:  Vitals:   04/10/17 0801 04/10/17 1023  BP: (!) 188/80 (!) 155/80  Pulse: 64 63  Resp: 18 16  Temp: (!) 35.8 C   SpO2: 99% 99%    Last Pain:  Vitals:   04/10/17 0801  TempSrc: Tympanic         Complications: No apparent anesthesia complications

## 2017-04-10 NOTE — Discharge Instructions (Signed)
Eye Surgery Discharge Instructions  Expect mild scratchy sensation or mild soreness. DO NOT RUB YOUR EYE!  The day of surgery:  Minimal physical activity, but bed rest is not required  No reading, computer work, or close hand work  No bending, lifting, or straining.  May watch TV  For 24 hours:  No driving, legal decisions, or alcoholic beverages  Safety precautions  Eat anything you prefer: It is better to start with liquids, then soup then solid foods.  _____ Eye patch should be worn until postoperative exam tomorrow.  ____ Solar shield eyeglasses should be worn for comfort in the sunlight/patch while sleeping  Resume all regular medications including aspirin or Coumadin if these were discontinued prior to surgery. You may shower, bathe, shave, or wash your hair. Tylenol may be taken for mild discomfort.  Call your doctor if you experience significant pain, nausea, or vomiting, fever > 101 or other signs of infection. [phone] or [phone] Specific instructions:  Follow-up Information    Leandrew Koyanagi, MD Follow up on 04/11/2017.   Specialty:  Ophthalmology Why:  follow up appointment in the office at 9:15 am Contact information: [address]   [place] [address] [phone]

## 2017-04-10 NOTE — Anesthesia Preprocedure Evaluation (Addendum)
Anesthesia Evaluation  Patient identified by MRN, date of birth, ID band Patient awake    Reviewed: Allergy & Precautions, NPO status , Patient's Chart, lab work & pertinent test results  History of Anesthesia Complications Negative for: history of anesthetic complications  Airway Mallampati: II       Dental   Pulmonary neg sleep apnea, neg COPD,           Cardiovascular hypertension (borderline, no meds), (-) Past MI and (-) CHF (-) dysrhythmias (-) Valvular Problems/Murmurs     Neuro/Psych neg Seizures    GI/Hepatic Neg liver ROS, neg GERD  ,  Endo/Other  neg diabetes  Renal/GU negative Renal ROS     Musculoskeletal   Abdominal   Peds  Hematology   Anesthesia Other Findings   Reproductive/Obstetrics                            Anesthesia Physical Anesthesia Plan  ASA: II  Anesthesia Plan: MAC   Post-op Pain Management:    Induction: Intravenous  PONV Risk Score and Plan:   Airway Management Planned: Nasal Cannula  Additional Equipment:   Intra-op Plan:   Post-operative Plan:   Informed Consent: I have reviewed the patients History and Physical, chart, labs and discussed the procedure including the risks, benefits and alternatives for the proposed anesthesia with the patient or authorized representative who has indicated his/her understanding and acceptance.     Plan Discussed with:   Anesthesia Plan Comments:         Anesthesia Quick Evaluation

## 2017-04-10 NOTE — Anesthesia Post-op Follow-up Note (Signed)
Anesthesia QCDR form completed.        

## 2017-04-10 NOTE — Op Note (Signed)
OPERATIVE NOTE  Cassie CASTNER, PhD [id number] 04/10/2017   PREOPERATIVE DIAGNOSIS:  Nuclear sclerotic cataract left eye. H25.12   POSTOPERATIVE DIAGNOSIS:    Nuclear sclerotic cataract left eye.     PROCEDURE:  Phacoemusification with posterior chamber intraocular lens placement of the left eye   LENS:   Implant Name Type Inv. Item Serial No. Manufacturer Lot No. LRB No. Used  LENS IOL RETOR - [id number]   LENS IOL RETOR [id number]   Left 1     Tecnis [id number] 14.5 D with +2.75 Add   ULTRASOUND TIME: 15 % of 1 minutes, 13 seconds.  CDE 10.8   SURGEON:  Wyonia Hough, MD   ANESTHESIA:  Topical with tetracaine drops and 2% Xylocaine jelly, augmented with 1% preservative-free intracameral lidocaine.    COMPLICATIONS:  None.   DESCRIPTION OF PROCEDURE:  The patient was identified in the holding room and transported to the operating room and placed in the supine position under the operating microscope.  The left eye was identified as the operative eye and it was prepped and draped in the usual sterile ophthalmic fashion.   A 1 millimeter clear-corneal paracentesis was made at the 1:30 position. 0.5 ml of preservative-free 1% lidocaine was injected into the anterior chamber.  The anterior chamber was filled with Viscoat viscoelastic.  A 2.4 millimeter keratome was used to make a near-clear corneal incision at the 10:30 position.  .  A curvilinear capsulorrhexis was made with a cystotome and capsulorrhexis forceps.  Balanced salt solution was used to hydrodissect and hydrodelineate the nucleus.   Phacoemulsification was then used in stop and chop fashion to remove the lens nucleus and epinucleus.  The remaining cortex was then removed using the irrigation and aspiration handpiece. Provisc was then placed into the capsular bag to distend it for lens placement.  A lens was then injected into the capsular bag.  The remaining viscoelastic was aspirated.   Wounds were hydrated with  balanced salt solution.  The anterior chamber was inflated to a physiologic pressure with balanced salt solution. Vigamox 0.2 ml of a 1mg  per ml solution was injected into the anterior chamber for a dose of 0.2 mg of intracameral antibiotic at the completion of the case.  Miostat was placed into the anterior chamber to constrict the pupil.  No wound leaks were noted.  Topical Vigamox drops and Maxitrol ointment were applied to the eye.  The patient was taken to the recovery room in stable condition without complications of anesthesia or surgery  [name] 04/10/2017, 10:20 AM

## 2017-04-10 NOTE — H&P (Signed)
The History and Physical notes are on paper, have been signed, and are to be scanned. The patient remains stable and unchanged from the H&P.   Previous H&P reviewed, patient examined, and there are no changes.  Cassie Lopez 04/10/2017 8:41 AM

## 2017-04-10 NOTE — Anesthesia Postprocedure Evaluation (Signed)
Anesthesia Post Note  Patient: Cassie Starch, PhD  Procedure(s) Performed: Procedure(s) (LRB): CATARACT EXTRACTION PHACO AND INTRAOCULAR LENS PLACEMENT ([facility]) (Left)  Patient location during evaluation: Other Anesthesia Type: MAC Level of consciousness: awake and alert Pain management: pain level controlled Vital Signs Assessment: post-procedure vital signs reviewed and stable Respiratory status: spontaneous breathing and respiratory function stable Cardiovascular status: stable Anesthetic complications: no     Last Vitals:  Vitals:   04/10/17 0801 04/10/17 1023  BP: (!) 188/80 (!) 155/80  Pulse: 64 67  Resp: 18 16  Temp: (!) 35.8 C 36.7 C  SpO2: 99% 99%    Last Pain:  Vitals:   04/10/17 1023  TempSrc: Temporal                 Tavien Chestnut K

## 2017-04-10 NOTE — Anesthesia Postprocedure Evaluation (Signed)
Anesthesia Post Note  Patient: Cassie Starch, Cassie Lopez  Procedure(s) Performed: Procedure(s) (LRB): CATARACT EXTRACTION PHACO AND INTRAOCULAR LENS PLACEMENT ([place]) (Left)  Patient location during evaluation: Short Stay Anesthesia Type: MAC Level of consciousness: awake and alert and oriented Pain management: pain level controlled Vital Signs Assessment: post-procedure vital signs reviewed and stable Respiratory status: spontaneous breathing and nonlabored ventilation Cardiovascular status: stable Postop Assessment: no headache and adequate PO intake Anesthetic complications: no     Last Vitals:  Vitals:   04/10/17 0801 04/10/17 1023  BP: (!) 188/80 (!) 155/80  Pulse: 64 67  Resp: 18 16  Temp: (!) 35.8 C 36.7 C  SpO2: 99% 99%    Last Pain:  Vitals:   04/10/17 1023  TempSrc: Temporal                 Lanora Manis

## 2017-04-11 ENCOUNTER — Encounter: Payer: Self-pay | Admitting: Ophthalmology

## 2017-05-09 NOTE — Discharge Instructions (Signed)

## 2017-05-14 ENCOUNTER — Ambulatory Visit: Payer: Medicare Other | Admitting: Anesthesiology

## 2017-05-14 ENCOUNTER — Ambulatory Visit
Admission: RE | Admit: 2017-05-14 | Discharge: 2017-05-14 | Disposition: A | Discharge status: Home / Self Care | Payer: Medicare Other | Source: Ambulatory Visit | Attending: Ophthalmology | Admitting: Ophthalmology

## 2017-05-14 ENCOUNTER — Encounter
Admission: RE | Disposition: A | Discharge status: Home / Self Care | Payer: Self-pay | Source: Ambulatory Visit | Attending: Ophthalmology

## 2017-05-14 DIAGNOSIS — Z853 Personal history of malignant neoplasm of breast: Secondary | ICD-10-CM | POA: Diagnosis not present

## 2017-05-14 DIAGNOSIS — H2511 Age-related nuclear cataract, right eye: Secondary | ICD-10-CM | POA: Diagnosis present

## 2017-05-14 DIAGNOSIS — I10 Essential (primary) hypertension: Secondary | ICD-10-CM | POA: Insufficient documentation

## 2017-05-14 HISTORY — PX: CATARACT EXTRACTION W/PHACO: SHX586

## 2017-05-14 SURGERY — PHACOEMULSIFICATION, CATARACT, WITH IOL INSERTION
Anesthesia: Monitor Anesthesia Care | Laterality: Right | Wound class: Clean

## 2017-05-14 MED ORDER — ACETAMINOPHEN 160 MG/5ML PO SOLN: 325.0000 mg | ORAL | Status: DC | PRN

## 2017-05-14 MED ORDER — CEFUROXIME OPHTHALMIC INJECTION 1 MG/0.1 ML
INJECTION | OPHTHALMIC | Status: DC | PRN
Start: 2017-05-14 — End: 2017-05-14
  Administered 2017-05-14: 0.1 mL via INTRACAMERAL

## 2017-05-14 MED ORDER — EPINEPHRINE PF 1 MG/ML IJ SOLN
INTRAOCULAR | Status: DC | PRN
  Administered 2017-05-14: 75 mL via OPHTHALMIC

## 2017-05-14 MED ORDER — ARMC OPHTHALMIC DILATING DROPS
1.0000 "application " | OPHTHALMIC | Status: DC | PRN
  Administered 2017-05-14 (×3): 1 via OPHTHALMIC

## 2017-05-14 MED ORDER — ACETAMINOPHEN 325 MG PO TABS: 325.0000 mg | ORAL_TABLET | ORAL | Status: DC | PRN

## 2017-05-14 MED ORDER — FENTANYL CITRATE (PF) 100 MCG/2ML IJ SOLN
INTRAMUSCULAR | Status: DC | PRN
  Administered 2017-05-14: 50 ug via INTRAVENOUS

## 2017-05-14 MED ORDER — NA HYALUR & NA CHOND-NA HYALUR 0.4-0.35 ML IO KIT
PACK | INTRAOCULAR | Status: DC | PRN
  Administered 2017-05-14: 1 mL via INTRAOCULAR

## 2017-05-14 MED ORDER — MIDAZOLAM HCL 2 MG/2ML IJ SOLN
INTRAMUSCULAR | Status: DC | PRN
  Administered 2017-05-14: 2 mg via INTRAVENOUS

## 2017-05-14 MED ORDER — LIDOCAINE HCL (PF) 2 % IJ SOLN
INTRAMUSCULAR | Status: DC | PRN
  Administered 2017-05-14: 1 mL

## 2017-05-14 MED ORDER — LACTATED RINGERS IV SOLN: 10.0000 mL/h | INTRAVENOUS | Status: DC

## 2017-05-14 MED ORDER — ONDANSETRON HCL 4 MG/2ML IJ SOLN: 4.0000 mg | Freq: Once | INTRAMUSCULAR | Status: DC | PRN

## 2017-05-14 MED ORDER — MOXIFLOXACIN HCL 0.5 % OP SOLN
1.0000 [drp] | OPHTHALMIC | Status: DC | PRN
  Administered 2017-05-14 (×3): 1 [drp] via OPHTHALMIC

## 2017-05-14 MED ORDER — BRIMONIDINE TARTRATE-TIMOLOL 0.2-0.5 % OP SOLN
OPHTHALMIC | Status: DC | PRN
  Administered 2017-05-14: 1 [drp] via OPHTHALMIC

## 2017-05-14 SURGICAL SUPPLY — 25 items
CANNULA ANT/CHMB 27GA (MISCELLANEOUS) ×3 IMPLANT
CARTRIDGE ABBOTT (MISCELLANEOUS) IMPLANT
GLOVE SURG LX 7.5 STRW (GLOVE) ×2
GLOVE SURG LX STRL 7.5 STRW (GLOVE) ×1 IMPLANT
GLOVE SURG TRIUMPH 8.0 PF LTX (GLOVE) ×3 IMPLANT
GOWN STRL REUS W/ TWL LRG LVL3 (GOWN DISPOSABLE) ×2 IMPLANT
GOWN STRL REUS W/TWL LRG LVL3 (GOWN DISPOSABLE) ×4
LENS IOL MULTIFOCL TECNIS 15.5 (Intraocular Lens) ×3 IMPLANT
MARKER SKIN DUAL TIP RULER LAB (MISCELLANEOUS) ×3 IMPLANT
NDL RETROBULBAR .5 NSTRL (NEEDLE) IMPLANT
NEEDLE FILTER BLUNT 18X 1/2SAF (NEEDLE) ×2
NEEDLE FILTER BLUNT 18X1 1/2 (NEEDLE) ×1 IMPLANT
PACK CATARACT BRASINGTON (MISCELLANEOUS) ×3 IMPLANT
PACK EYE AFTER SURG (MISCELLANEOUS) ×3 IMPLANT
PACK OPTHALMIC (MISCELLANEOUS) ×3 IMPLANT
RING MALYGIN 7.0 (MISCELLANEOUS) IMPLANT
SUT ETHILON 10-0 CS-B-6CS-B-6 (SUTURE)
SUT VICRYL  9 0 (SUTURE)
SUT VICRYL 9 0 (SUTURE) IMPLANT
SUTURE EHLN 10-0 CS-B-6CS-B-6 (SUTURE) IMPLANT
SYR 3ML LL SCALE MARK (SYRINGE) ×3 IMPLANT
SYR 5ML LL (SYRINGE) ×3 IMPLANT
SYR TB 1ML LUER SLIP (SYRINGE) ×3 IMPLANT
WATER STERILE IRR 250ML POUR (IV SOLUTION) ×3 IMPLANT
WIPE NON LINTING 3.25X3.25 (MISCELLANEOUS) ×3 IMPLANT

## 2017-05-14 NOTE — Op Note (Signed)
LOCATION:  [place]   PREOPERATIVE DIAGNOSIS:    Nuclear sclerotic cataract right eye. H25.11   POSTOPERATIVE DIAGNOSIS:  Nuclear sclerotic cataract right eye.     PROCEDURE:  Phacoemusification with posterior chamber intraocular lens placement of the right eye   LENS:   Implant Name Type Inv. Item Serial No. Manufacturer Lot No. LRB No. Used  Tecnis multifocal IOL Intraocular Lens   [id number] [employer]   Right 1     [id number] 15.5 D Tecnis IOL with +2.75D add power   ULTRASOUND TIME: 17 % of 1 minutes, 27 seconds.  CDE 14.5   SURGEON:  Wyonia Hough, MD   ANESTHESIA:  Topical with tetracaine drops and 2% Xylocaine jelly, augmented with 1% preservative-free intracameral lidocaine.    COMPLICATIONS:  None.   DESCRIPTION OF PROCEDURE:  The patient was identified in the holding room and transported to the operating room and placed in the supine position under the operating microscope.  The right eye was identified as the operative eye and it was prepped and draped in the usual sterile ophthalmic fashion.   A 1 millimeter clear-corneal paracentesis was made at the 12:00 position.  0.5 ml of preservative-free 1% lidocaine was injected into the anterior chamber. The anterior chamber was filled with Viscoat viscoelastic.  A 2.4 millimeter keratome was used to make a near-clear corneal incision at the 9:00 position.  A curvilinear capsulorrhexis was made with a cystotome and capsulorrhexis forceps.  Balanced salt solution was used to hydrodissect and hydrodelineate the nucleus.   Phacoemulsification was then used in stop and chop fashion to remove the lens nucleus and epinucleus.  The remaining cortex was then removed using the irrigation and aspiration handpiece. Provisc was then placed into the capsular bag to distend it for lens placement.  A lens was then injected into the capsular bag.  The remaining viscoelastic was aspirated.   Wounds were hydrated with  balanced salt solution.  The anterior chamber was inflated to a physiologic pressure with balanced salt solution.  No wound leaks were noted. Cefuroxime 0.1 ml of a 10mg /ml solution was injected into the anterior chamber for a dose of 1 mg of intracameral antibiotic at the completion of the case.   Timolol and Brimonidine drops were applied to the eye.  The patient was taken to the recovery room in stable condition without complications of anesthesia or surgery.   Neill Jurewicz 05/14/2017, 8:06 AM

## 2017-05-14 NOTE — Anesthesia Postprocedure Evaluation (Signed)
Anesthesia Post Note  Patient: Cassie Starch, PhD  Procedure(s) Performed: CATARACT EXTRACTION PHACO AND INTRAOCULAR LENS PLACEMENT ([facility])  RIGHT (Right )  Patient location during evaluation: PACU Anesthesia Type: MAC Level of consciousness: awake Pain management: pain level controlled Vital Signs Assessment: post-procedure vital signs reviewed and stable Respiratory status: respiratory function stable Cardiovascular status: stable Postop Assessment: no signs of nausea or vomiting Anesthetic complications: no    Veda Canning

## 2017-05-14 NOTE — H&P (Signed)
The History and Physical notes are on paper, have been signed, and are to be scanned. The patient remains stable and unchanged from the H&P.   Previous H&P reviewed, patient examined, and there are no changes.  Cassie Lopez 05/14/2017 7:42 AM

## 2017-05-14 NOTE — Transfer of Care (Signed)
Immediate Anesthesia Transfer of Care Note  Patient: Cassie Starch, PhD  Procedure(s) Performed: CATARACT EXTRACTION PHACO AND INTRAOCULAR LENS PLACEMENT ([facility])  RIGHT (Right )  Patient Location: PACU  Anesthesia Type: MAC  Level of Consciousness: awake, alert  and patient cooperative  Airway and Oxygen Therapy: Patient Spontanous Breathing and Patient connected to supplemental oxygen  Post-op Assessment: Post-op Vital signs reviewed, Patient's Cardiovascular Status Stable, Respiratory Function Stable, Patent Airway and No signs of Nausea or vomiting  Post-op Vital Signs: Reviewed and stable  Complications: No apparent anesthesia complications

## 2017-05-14 NOTE — Anesthesia Preprocedure Evaluation (Signed)
Anesthesia Evaluation  Patient identified by MRN, date of birth, ID band Patient awake    Reviewed: Allergy & Precautions, NPO status , Patient's Chart, lab work & pertinent test results  Airway Mallampati: II  TM Distance: >3 FB     Dental   Pulmonary neg pulmonary ROS,    breath sounds clear to auscultation       Cardiovascular hypertension (no longer on medications), (-) angina Rhythm:Regular Rate:Normal     Neuro/Psych negative neurological ROS     GI/Hepatic negative GI ROS,   Endo/Other    Renal/GU      Musculoskeletal   Abdominal   Peds  Hematology   Anesthesia Other Findings History of breast cancer  Reproductive/Obstetrics                            Anesthesia Physical Anesthesia Plan  ASA: II  Anesthesia Plan: MAC   Post-op Pain Management:    Induction: Intravenous  PONV Risk Score and Plan:   Airway Management Planned: Nasal Cannula  Additional Equipment:   Intra-op Plan:   Post-operative Plan:   Informed Consent: I have reviewed the patients History and Physical, chart, labs and discussed the procedure including the risks, benefits and alternatives for the proposed anesthesia with the patient or authorized representative who has indicated his/her understanding and acceptance.     Plan Discussed with: CRNA  Anesthesia Plan Comments:        Anesthesia Quick Evaluation

## 2017-05-14 NOTE — Anesthesia Procedure Notes (Signed)
Procedure Name: MAC Performed by: Roann Merk Pre-anesthesia Checklist: Patient identified, Emergency Drugs available, Suction available, Timeout performed and Patient being monitored Patient Re-evaluated:Patient Re-evaluated prior to inductionOxygen Delivery Method: Nasal cannula Placement Confirmation: positive ETCO2     

## 2017-05-15 ENCOUNTER — Encounter: Payer: Self-pay | Admitting: Ophthalmology

## 2017-07-10 ENCOUNTER — Other Ambulatory Visit: Payer: Self-pay

## 2017-07-10 DIAGNOSIS — C50412 Malignant neoplasm of upper-outer quadrant of left female breast: Secondary | ICD-10-CM

## 2017-07-10 DIAGNOSIS — Z170 Estrogen receptor positive status [ER+]: Principal | ICD-10-CM

## 2017-07-10 DIAGNOSIS — M810 Age-related osteoporosis without current pathological fracture: Secondary | ICD-10-CM

## 2017-08-14 ENCOUNTER — Ambulatory Visit
Admission: RE | Admit: 2017-08-14 | Discharge: 2017-08-14 | Disposition: A | Discharge status: Home / Self Care | Payer: Medicare Other | Source: Ambulatory Visit | Attending: General Surgery | Admitting: General Surgery

## 2017-08-14 DIAGNOSIS — M810 Age-related osteoporosis without current pathological fracture: Secondary | ICD-10-CM

## 2017-08-14 DIAGNOSIS — C50412 Malignant neoplasm of upper-outer quadrant of left female breast: Secondary | ICD-10-CM | POA: Diagnosis not present

## 2017-08-14 DIAGNOSIS — Z170 Estrogen receptor positive status [ER+]: Secondary | ICD-10-CM | POA: Insufficient documentation

## 2017-08-14 DIAGNOSIS — M818 Other osteoporosis without current pathological fracture: Secondary | ICD-10-CM | POA: Insufficient documentation

## 2017-08-14 DIAGNOSIS — M85832 Other specified disorders of bone density and structure, left forearm: Secondary | ICD-10-CM | POA: Insufficient documentation

## 2017-08-19 ENCOUNTER — Telehealth: Payer: Self-pay | Admitting: *Deleted

## 2017-08-19 NOTE — Telephone Encounter (Signed)
Notified patient as instructed, patient pleased. Discussed follow-up appointments, patient agrees  

## 2017-08-19 NOTE — Telephone Encounter (Signed)
-----   Message from Robert Bellow, MD sent at 08/19/2017 12:39 PM EST ----- Please notify the patient no change in bone density (still w/ osteoporosis). Have forwarded results to Dr. Ouida Sills for his review and recommendations re: other strategies to prevent additional bone loss.  ----- Message ----- From: Interface, Rad Results In Sent: 08/14/2017   9:18 AM To: Robert Bellow, MD

## 2017-09-11 ENCOUNTER — Other Ambulatory Visit: Payer: Self-pay | Admitting: *Deleted

## 2017-09-11 ENCOUNTER — Ambulatory Visit (INDEPENDENT_AMBULATORY_CARE_PROVIDER_SITE_OTHER): Payer: Medicare Other | Admitting: General Surgery

## 2017-09-11 ENCOUNTER — Encounter: Payer: Self-pay | Admitting: General Surgery

## 2017-09-11 VITALS — BP 164/76 | HR 80 | Resp 12 | Ht 65.0 in | Wt 144.0 lb

## 2017-09-11 DIAGNOSIS — Z170 Estrogen receptor positive status [ER+]: Secondary | ICD-10-CM | POA: Diagnosis not present

## 2017-09-11 DIAGNOSIS — C50412 Malignant neoplasm of upper-outer quadrant of left female breast: Secondary | ICD-10-CM

## 2017-09-11 MED ORDER — LETROZOLE 2.5 MG PO TABS: 2.5000 mg | ORAL_TABLET | Freq: Every day | ORAL | 3 refills | Status: DC

## 2017-09-11 NOTE — Progress Notes (Signed)
Patient ID: Cassie Lopez, female   DOB: 1941-04-09, 77 y.o.   MRN: [id number]  Chief Complaint  Patient presents with  . Breast Cancer    HPI Cassie Lopez is a 77 y.o. female.  Here for follow up left breast cancer and breast evaluation. Bone density was 08-14-17. Tolerating letrozole.  HPI  Past Medical History:  Diagnosis Date  . Breast cancer (Cassie Lopez) 2017   bilateral breast cancer  . Hypertension    OFF MEDS X 6 MONTHS DUE TO CONTROLLED BP (08-15-15)  . Osteoporosis     Past Surgical History:  Procedure Laterality Date  . ANKLE SURGERY Left 2011  . BILATERAL TOTAL MASTECTOMY WITH AXILLARY LYMPH NODE DISSECTION Bilateral 08/24/2015   Left T2, N61m; ER/PR +; Her 2 neu not overexpressed, Right: DCIS.   Cassie Lopez Kitchen BREAST BIOPSY Right 08/08/15   stereo/DUCTAL CARCINOMA IN SITU   . BREAST BIOPSY Left 08-15-15   INVASIVE CARCINOMA, dr Cassie Lopez  . CATARACT EXTRACTION W/PHACO Left 04/10/2017   Procedure: CATARACT EXTRACTION PHACO AND INTRAOCULAR LENS PLACEMENT ([facility]);  Surgeon: Cassie Koyanagi, MD;  Location: [facility];  Service: Ophthalmology;  Laterality: Left;  [place] 01:13.3 AP% 14.8 CDE 10.81 Fluid Pack  lot # [id number]  . CATARACT EXTRACTION W/PHACO Right 05/14/2017   Procedure: CATARACT EXTRACTION PHACO AND INTRAOCULAR LENS PLACEMENT (Cassie Lopez)  RIGHT;  Surgeon: Cassie Koyanagi, MD;  Location: [place];  Service: Ophthalmology;  Laterality: Right;  . FRACTURE SURGERY    . left breast excision Left   . MASTECTOMY Bilateral 08/24/2015  . SENTINEL NODE BIOPSY Bilateral 08/24/2015   Procedure: SENTINEL NODE BIOPSY;  Surgeon: Cassie Bellow, MD;  Location: [facility];  Service: General;  Laterality: Bilateral;  . WRIST FRACTURE SURGERY      Family History  Problem Relation Age of Onset  . Cancer Neg Hx     Social History Social History   Tobacco Use  . Smoking status: Never Smoker  . Smokeless tobacco: Never Used  Substance Use Topics  . Alcohol use: No     Alcohol/week: 0.0 oz  . Drug use: No    Allergies  Allergen Reactions  . Bactericin [Bacitracin] Rash    bactrim    Current Outpatient Medications  Medication Sig Dispense Refill  . aspirin 81 MG tablet Take 81 mg by mouth daily.    Cassie Lopez Kitchen b complex vitamins tablet Take 1 tablet by mouth daily.    . calcium carbonate (CALCIUM 600) 600 MG TABS tablet Take 1,200 mg by mouth daily with breakfast.     . Calcium-Magnesium-Vitamin D (CALCIUM 500 PO) Take 1 tablet by mouth daily.    . Cholecalciferol (VITAMIN D3) 1000 units CAPS Take 2,000 Units by mouth daily.     . Omega-3 Fatty Acids (FISH OIL) 1000 MG CAPS Take by mouth.    . Selenium 200 MCG CAPS Take by mouth.    . letrozole (FEMARA) 2.5 MG tablet Take 1 tablet (2.5 mg total) by mouth daily. 90 tablet 3   No current facility-administered medications for this visit.     Review of Systems Review of Systems  Constitutional: Negative.   Respiratory: Negative.   Cardiovascular: Negative.     Blood pressure (!) 164/76, pulse 80, resp. rate 12, height 5\' 5"  (1.651 m), weight 144 lb (65.3 kg).  Physical Exam Physical Exam  Constitutional: She is oriented to person, place, and time. She appears well-developed and well-nourished.  HENT:  Mouth/Throat: Oropharynx is clear and moist.  Eyes:  Conjunctivae are normal. No scleral icterus.  Neck: Neck supple.  Cardiovascular: Normal rate, regular rhythm and normal heart sounds.  Pulmonary/Chest: Effort normal and breath sounds normal.    Bilateral mastectomy sites clean  Lymphadenopathy:    She has no cervical adenopathy.  Neurological: She is alert and oriented to person, place, and time.  Skin: Skin is warm and dry.  Psychiatric: Her behavior is normal.    Data Reviewed CA 27-29: Pending.  Assessment    Doing well post bilateral mastectomy.  Mild progression of osteopenia to osteoporosis at the hip.    Plan  The patient is making use of calcium supplements, remains active and  makes use of weightbearing exercise.  We will ask her PCP whether she would benefit from resumption of Boniva or similar medication to improve her bone density.    'Follow up in one year    The patient will get a CA 27-29 drawn in the near future.  HPI, Physical Exam, Assessment and Plan have been scribed under the direction and in the presence of Cassie Bellow, MD. Cassie Fetch, RN  I have completed the exam and reviewed the above documentation for accuracy and completeness.  I agree with the above.  Haematologist has been used and any errors in dictation or transcription are unintentional.  Cassie Lopez, M.D., F.A.C.S.  Cassie Lopez 09/12/2017, 7:59 PM  Patient to have the following labs drawn at [facility] when convenient: CA 27.29. Refill for Letrozole 2.5 mg #90 3 refills sent in to [place] today.   Dominga Ferry, CMA

## 2017-09-11 NOTE — Patient Instructions (Addendum)
The patient is aware to call back for any questions or new concerns.  Patient to have the following labs drawn at [facility]: CA 27.29. Please report to the [place] Registration desk (first desk on the right) to have your lab work completed.  Return to see Dr. Bary Castilla in one year.

## 2017-09-19 ENCOUNTER — Other Ambulatory Visit
Admission: RE | Admit: 2017-09-19 | Discharge: 2017-09-19 | Disposition: A | Discharge status: Home / Self Care | Payer: Medicare Other | Source: Ambulatory Visit | Attending: General Surgery | Admitting: General Surgery

## 2017-09-19 DIAGNOSIS — Z170 Estrogen receptor positive status [ER+]: Secondary | ICD-10-CM | POA: Insufficient documentation

## 2017-09-19 DIAGNOSIS — C50412 Malignant neoplasm of upper-outer quadrant of left female breast: Secondary | ICD-10-CM | POA: Insufficient documentation

## 2017-09-20 LAB — CANCER ANTIGEN 27.29: CA 27.29: 20.9 U/mL (ref 0.0–38.6)

## 2017-09-27 ENCOUNTER — Encounter: Payer: Self-pay | Admitting: General Surgery

## 2018-09-10 ENCOUNTER — Other Ambulatory Visit: Payer: Self-pay

## 2018-09-10 ENCOUNTER — Ambulatory Visit (INDEPENDENT_AMBULATORY_CARE_PROVIDER_SITE_OTHER): Payer: Medicare Other | Admitting: General Surgery

## 2018-09-10 ENCOUNTER — Encounter: Payer: Self-pay | Admitting: General Surgery

## 2018-09-10 VITALS — BP 169/86 | HR 62 | Temp 96.6°F | Resp 14 | Ht 65.0 in | Wt 140.0 lb

## 2018-09-10 DIAGNOSIS — C50412 Malignant neoplasm of upper-outer quadrant of left female breast: Secondary | ICD-10-CM

## 2018-09-10 DIAGNOSIS — Z170 Estrogen receptor positive status [ER+]: Secondary | ICD-10-CM | POA: Diagnosis not present

## 2018-09-10 NOTE — Progress Notes (Signed)
Patient ID: Cassie Lopez, female   DOB: [birth year], 78 y.o.   MRN: [id number]  Chief Complaint  Patient presents with  . Follow-up    one year f/u breast ca    HPI Cassie Lopez is a 78 y.o. female here today for 1 year breast cancer follow up. Patient states she is feeling well.   The patient's husband of over 104 years died in 06/16/18.  He had been in reasonably good health but developed rapidly progressive dementia.  HPI  Past Medical History:  Diagnosis Date  . Breast cancer ([place]) 2017   bilateral breast cancer  . Hypertension    OFF MEDS X 6 MONTHS DUE TO CONTROLLED BP (08-15-15)  . Osteoporosis     Past Surgical History:  Procedure Laterality Date  . ANKLE SURGERY Left 2011  . BILATERAL TOTAL MASTECTOMY WITH AXILLARY LYMPH NODE DISSECTION Bilateral 08/24/2015   Left T2, N49m; ER/PR +; Her 2 neu not overexpressed, Right: DCIS.   Marland Kitchen BREAST BIOPSY Right 08/08/15   stereo/DUCTAL CARCINOMA IN SITU   . BREAST BIOPSY Left 08-15-15   INVASIVE CARCINOMA, dr Lopez  . CATARACT EXTRACTION W/PHACO Left 04/10/2017   Procedure: CATARACT EXTRACTION PHACO AND INTRAOCULAR LENS PLACEMENT ([facility]);  Surgeon: Cassie Koyanagi, MD;  Location: [facility];  Service: Ophthalmology;  Laterality: Left;  [place] 01:13.3 AP% 14.8 CDE 10.81 Fluid Pack  lot # [id number]  . CATARACT EXTRACTION W/PHACO Right 05/14/2017   Procedure: CATARACT EXTRACTION PHACO AND INTRAOCULAR LENS PLACEMENT ([place])  RIGHT;  Surgeon: Cassie Koyanagi, MD;  Location: [place];  Service: Ophthalmology;  Laterality: Right;  . FRACTURE SURGERY    . left breast excision Left   . MASTECTOMY Bilateral 08/24/2015  . SENTINEL NODE BIOPSY Bilateral 08/24/2015   Procedure: SENTINEL NODE BIOPSY;  Surgeon: Cassie Bellow, MD;  Location: [facility];  Service: General;  Laterality: Bilateral;  . WRIST FRACTURE SURGERY      Family History  Problem Relation Age of Onset  . Cancer Neg Hx     Social History Social  History   Tobacco Use  . Smoking status: Never Smoker  . Smokeless tobacco: Never Used  Substance Use Topics  . Alcohol use: No    Alcohol/week: 0.0 standard drinks  . Drug use: No    Allergies  Allergen Reactions  . Bactericin [Bacitracin] Rash    bactrim    Current Outpatient Medications  Medication Sig Dispense Refill  . aspirin 81 MG tablet Take 81 mg by mouth daily.    Marland Kitchen b complex vitamins tablet Take 1 tablet by mouth daily.    . calcium carbonate (CALCIUM 600) 600 MG TABS tablet Take 1,200 mg by mouth daily with breakfast.     . Calcium-Magnesium-Vitamin D (CALCIUM 500 PO) Take 1 tablet by mouth daily.    . Cholecalciferol (VITAMIN D3) 1000 units CAPS Take 2,000 Units by mouth daily.     Marland Kitchen letrozole (FEMARA) 2.5 MG tablet Take 1 tablet (2.5 mg total) by mouth daily. 90 tablet 3  . Omega-3 Fatty Acids (FISH OIL) 1000 MG CAPS Take by mouth.    . Selenium 200 MCG CAPS Take by mouth.     No current facility-administered medications for this visit.     Review of Systems Review of Systems  Constitutional: Negative.   Respiratory: Negative.   Cardiovascular: Negative.     Blood pressure (!) 169/86, pulse 62, temperature (!) 96.6 F (35.9 C), temperature source Skin, resp. rate 14,  height 5\' 5"  (1.651 m), weight 140 lb (63.5 kg).  Physical Exam Physical Exam Constitutional:      Appearance: She is well-developed.  Eyes:     General: No scleral icterus.    Conjunctiva/sclera: Conjunctivae normal.  Neck:     Musculoskeletal: Normal range of motion.  Cardiovascular:     Rate and Rhythm: Normal rate and regular rhythm.     Heart sounds: Normal heart sounds.  Pulmonary:     Effort: Pulmonary effort is normal.     Breath sounds: Normal breath sounds.  Chest:    Lymphadenopathy:     Cervical: No cervical adenopathy.     Upper Body:     Right upper body: No supraclavicular or axillary adenopathy.     Left upper body: No supraclavicular or axillary adenopathy.   Skin:    General: Skin is warm and dry.  Neurological:     Mental Status: She is alert and oriented to person, place, and time.     Data Reviewed Bone density exam dated August 14, 2017 showed osteoporosis of the femur, osteopenia of the forearm.  Bone density exam of February 05, 2018 at the PCP office showed a T score of -2.4 for the femoral head, -2.1 for the hip.  Osteopenia.  Assessment No evidence of recurrent cancer now 3 years post bilateral mastectomy.  Good tolerance of letrozole.  Plan We will plan for a follow-up examination in 1 year.  The patient will continue letrozole.  Repeat bone density test in 2021 with her PCP.  The patient may benefit from adjuncts to improve her bone mineralization.  Will defer to her PCP.  HPI, Physical Exam, Assessment and Plan have been scribed under the direction and in the presence of Cassie Ard, Md.  [name], CMA  I have completed the exam and reviewed the above documentation for accuracy and completeness.  I agree with the above.  Haematologist has been used and any errors in dictation or transcription are unintentional.  Cassie Lopez, M.D., F.A.C.S.  Cassie Lopez 09/11/2018, 11:20 AM

## 2018-09-10 NOTE — Patient Instructions (Addendum)
Patient will need to return to the office in 1 year.   Call the office with any questions or concerns.

## 2018-09-21 ENCOUNTER — Other Ambulatory Visit: Payer: Self-pay | Admitting: General Surgery

## 2018-09-22 ENCOUNTER — Telehealth: Payer: Self-pay | Admitting: *Deleted

## 2018-09-22 MED ORDER — LETROZOLE 2.5 MG PO TABS: 2.5000 mg | ORAL_TABLET | Freq: Every day | ORAL | 3 refills | Status: DC

## 2018-09-22 NOTE — Telephone Encounter (Signed)
RX for letrozole printed and then I attempted to send it in as well E scribe unable so [facility]

## 2019-02-09 ENCOUNTER — Encounter: Payer: Self-pay | Admitting: General Surgery

## 2019-11-10 ENCOUNTER — Encounter: Payer: Self-pay | Admitting: Emergency Medicine

## 2019-11-10 ENCOUNTER — Emergency Department: Payer: Medicare Other

## 2019-11-10 ENCOUNTER — Emergency Department
Admission: EM | Admit: 2019-11-10 | Discharge: 2019-11-10 | Disposition: A | Discharge status: Home / Self Care | Payer: Medicare Other | Attending: Student | Admitting: Student

## 2019-11-10 ENCOUNTER — Other Ambulatory Visit: Payer: Self-pay

## 2019-11-10 DIAGNOSIS — R531 Weakness: Secondary | ICD-10-CM | POA: Diagnosis present

## 2019-11-10 DIAGNOSIS — I4891 Unspecified atrial fibrillation: Secondary | ICD-10-CM | POA: Diagnosis not present

## 2019-11-10 DIAGNOSIS — J189 Pneumonia, unspecified organism: Secondary | ICD-10-CM | POA: Insufficient documentation

## 2019-11-10 DIAGNOSIS — R0602 Shortness of breath: Secondary | ICD-10-CM | POA: Diagnosis not present

## 2019-11-10 DIAGNOSIS — Z853 Personal history of malignant neoplasm of breast: Secondary | ICD-10-CM | POA: Insufficient documentation

## 2019-11-10 LAB — COMPREHENSIVE METABOLIC PANEL
ALT: 61 U/L — ABNORMAL HIGH (ref 0–44)
AST: 53 U/L — ABNORMAL HIGH (ref 15–41)
Albumin: 4.7 g/dL (ref 3.5–5.0)
Alkaline Phosphatase: 55 U/L (ref 38–126)
Anion gap: 11 (ref 5–15)
BUN: 20 mg/dL (ref 8–23)
CO2: 23 mmol/L (ref 22–32)
Calcium: 9.6 mg/dL (ref 8.9–10.3)
Chloride: 107 mmol/L (ref 98–111)
Creatinine, Ser: 0.98 mg/dL (ref 0.44–1.00)
GFR calc Af Amer: >60 mL/min (ref >60)
GFR calc non Af Amer: 54 mL/min — ABNORMAL LOW (ref >60)
Glucose, Bld: 118 mg/dL — ABNORMAL HIGH (ref 70–99)
Potassium: 3.5 mmol/L (ref 3.5–5.1)
Sodium: 141 mmol/L (ref 135–145)
Total Bilirubin: 1.2 mg/dL (ref 0.3–1.2)
Total Protein: 7.8 g/dL (ref 6.5–8.1)

## 2019-11-10 LAB — CBC WITH DIFFERENTIAL/PLATELET
Abs Immature Granulocytes: 0.01 10*3/uL (ref 0.00–0.07)
Basophils Absolute: 0.1 10*3/uL (ref 0.0–0.1)
Basophils Relative: 1 %
Eosinophils Absolute: 0.2 10*3/uL (ref 0.0–0.5)
Eosinophils Relative: 4 %
HCT: 41.6 % (ref 36.0–46.0)
Hemoglobin: 13.9 g/dL (ref 12.0–15.0)
Immature Granulocytes: 0 %
Lymphocytes Relative: 23 %
Lymphs Abs: 1 10*3/uL (ref 0.7–4.0)
MCH: 30.5 pg (ref 26.0–34.0)
MCHC: 33.4 g/dL (ref 30.0–36.0)
MCV: 91.4 fL (ref 80.0–100.0)
Monocytes Absolute: 0.4 10*3/uL (ref 0.1–1.0)
Monocytes Relative: 8 %
Neutro Abs: 2.8 10*3/uL (ref 1.7–7.7)
Neutrophils Relative %: 64 %
Platelets: 238 10*3/uL (ref 150–400)
RBC: 4.55 MIL/uL (ref 3.87–5.11)
RDW: 14.6 % (ref 11.5–15.5)
WBC: 4.4 10*3/uL (ref 4.0–10.5)
nRBC: 0 % (ref 0.0–0.2)

## 2019-11-10 LAB — URINALYSIS, COMPLETE (UACMP) WITH MICROSCOPIC
Bacteria, UA: NONE SEEN
Bilirubin Urine: NEGATIVE
Glucose, UA: NEGATIVE mg/dL
Hgb urine dipstick: NEGATIVE
Ketones, ur: 20 mg/dL — AB
Leukocytes,Ua: NEGATIVE
Nitrite: NEGATIVE
Protein, ur: NEGATIVE mg/dL
Specific Gravity, Urine: 1.012 (ref 1.005–1.030)
Squamous Epithelial / HPF: NONE SEEN (ref 0–5)
pH: 6 (ref 5.0–8.0)

## 2019-11-10 LAB — TSH: TSH: 4.041 u[IU]/mL (ref 0.350–4.500)

## 2019-11-10 LAB — TROPONIN I (HIGH SENSITIVITY)
Troponin I (High Sensitivity): 23 ng/L — ABNORMAL HIGH (ref <18)
Troponin I (High Sensitivity): 22 ng/L — ABNORMAL HIGH (ref <18)

## 2019-11-10 LAB — MAGNESIUM: Magnesium: 2.2 mg/dL (ref 1.7–2.4)

## 2019-11-10 LAB — BRAIN NATRIURETIC PEPTIDE: B Natriuretic Peptide: 338 pg/mL — ABNORMAL HIGH (ref 0.0–100.0)

## 2019-11-10 LAB — PROCALCITONIN: Procalcitonin: <0.1 ng/mL

## 2019-11-10 LAB — T4, FREE: Free T4: 1.12 ng/dL (ref 0.61–1.12)

## 2019-11-10 LAB — CK: Total CK: 43 U/L (ref 38–234)

## 2019-11-10 MED ORDER — DILTIAZEM HCL 25 MG/5ML IV SOLN
10.0000 mg | Freq: Once | INTRAVENOUS | Status: AC
  Administered 2019-11-10: 10:00:00 10 mg via INTRAVENOUS
  Filled 2019-11-10: qty 5

## 2019-11-10 MED ORDER — SODIUM CHLORIDE 0.9 % IV SOLN
1.0000 g | Freq: Once | INTRAVENOUS | Status: AC
  Administered 2019-11-10: 12:00:00 1 g via INTRAVENOUS
  Filled 2019-11-10: qty 10

## 2019-11-10 MED ORDER — DILTIAZEM HCL 30 MG PO TABS
30.0000 mg | ORAL_TABLET | Freq: Four times a day (QID) | ORAL | 1 refills | Status: DC
Start: 2019-11-10 — End: 2019-12-03

## 2019-11-10 MED ORDER — SODIUM CHLORIDE 0.9 % IV BOLUS
500.0000 mL | Freq: Once | INTRAVENOUS | Status: AC
  Administered 2019-11-10: 500 mL via INTRAVENOUS

## 2019-11-10 MED ORDER — CEFDINIR 300 MG PO CAPS
300.0000 mg | ORAL_CAPSULE | Freq: Two times a day (BID) | ORAL | 0 refills | Status: AC
Start: 2019-11-10 — End: 2019-11-17

## 2019-11-10 MED ORDER — AZITHROMYCIN 250 MG PO TABS: 250.0000 mg | ORAL_TABLET | Freq: Every day | ORAL | 0 refills | Status: AC

## 2019-11-10 MED ORDER — DILTIAZEM HCL 60 MG PO TABS
30.0000 mg | ORAL_TABLET | Freq: Once | ORAL | Status: AC
  Administered 2019-11-10: 14:00:00 30 mg via ORAL
  Filled 2019-11-10: qty 1

## 2019-11-10 MED ORDER — DILTIAZEM HCL 25 MG/5ML IV SOLN
10.0000 mg | Freq: Once | INTRAVENOUS | Status: AC
  Administered 2019-11-10: 13:00:00 10 mg via INTRAVENOUS

## 2019-11-10 MED ORDER — SODIUM CHLORIDE 0.9 % IV SOLN
500.0000 mg | Freq: Once | INTRAVENOUS | Status: AC
  Administered 2019-11-10: 500 mg via INTRAVENOUS
  Filled 2019-11-10: qty 500

## 2019-11-10 MED ORDER — APIXABAN 5 MG PO TABS: 5.0000 mg | ORAL_TABLET | Freq: Two times a day (BID) | ORAL | 1 refills | Status: AC

## 2019-11-10 NOTE — ED Notes (Signed)
Tourniquet was left on for an unknown exact time period after IV access was obtained. Pt sleeve on gown had fallen down over tourniquet out of view.  When tourniquet was removed at around 1040, arm was assessed. Arm color, pulse and cap refill at that time was WNL, pt denied any pain, numbness at that time as well.  MD was notified at that time as well.  Pt now c/o left arm pain, extremity color, and cap refill reassessed at this time and remains WNL. MD notified of pain and states she will assess pt arm and add on a CK lab test.

## 2019-11-10 NOTE — ED Triage Notes (Signed)
C/O weakness and SOB x past 10-14 days.  Symptoms worse at night when trying to sleep.  Denies fever.  Went to see physician at [facility], and was found to be in afib.  Patient has no history of afib.    Patient is AAOx3.  Skin warm and dry .  No SOB/ DOE.  NAD

## 2019-11-10 NOTE — Discharge Instructions (Addendum)
Thank you for letting us take care of you in the emergency department today.   Please continue to take any regular, prescribed medications.   New medications we have prescribed:  Omnicef - antibiotic Azithromycin - antibiotic Diltiazem - to control your heart rate Eliquis - blood thinner (decrease risk of stroke in setting of atrial fibrillation, as we discussed)  Please follow up with: Your primary care doctor to review your ER visit and follow up on your symptoms.  Cardiology doctor, to discuss your new diagnosis of atrial fibrillation   Please return to the ER for any new or worsening symptoms.

## 2019-11-10 NOTE — ED Provider Notes (Signed)
[facility] Emergency Department Provider Note  ____________________________________________   First MD Initiated Contact with Patient 11/10/19 [phone]     (approximate)  I have reviewed the triage vital signs and the nursing notes.  History  Chief Complaint Weakness    HPI Cassie Starch, PhD is a 79 y.o. female past medical history as below, including bilateral mastectomy in 2017 for breast cancer (doing well), who presents to the ER for genralized weakness and SOB. Patient reports generalized weakness for the last few weeks, along with some insomnia. Mild SOB, nothing seems to make it better/worse. Also reports some difficulty swallowing due to dry mouth/throat. No fevers or cough. No chest pain, palpitations, syncope, or history of arrhythmia.  Vaccinated against COVID-19.  Denies any sick contacts.  Denies any leg swelling.   Past Medical Hx Past Medical History:  Diagnosis Date  . Breast cancer ([place]) 2017   bilateral breast cancer  . Hypertension    OFF MEDS X 6 MONTHS DUE TO CONTROLLED BP (08-15-15)  . Osteoporosis     Problem List Patient Active Problem List   Diagnosis Date Noted  . Breast cancer of upper-outer quadrant of left female breast ([place]) 08/18/2015  . 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 ANKLE SURGERY Left 2011  . BILATERAL TOTAL MASTECTOMY WITH AXILLARY LYMPH NODE DISSECTION Bilateral 08/24/2015   Left T2, N62m; ER/PR +; Her 2 neu not overexpressed, Right: DCIS.   Marland Kitchen BREAST BIOPSY Right 08/08/15   stereo/DUCTAL CARCINOMA IN SITU   . BREAST BIOPSY Left 08-15-15   INVASIVE CARCINOMA, dr byrnett  . CATARACT EXTRACTION W/PHACO Left 04/10/2017   Procedure: CATARACT EXTRACTION PHACO AND INTRAOCULAR LENS PLACEMENT (IOC);  Surgeon: Leandrew Koyanagi, MD;  Location: [facility] ORS;  Service: Ophthalmology;  Laterality: Left;  [place] 01:13.3 AP% 14.8 CDE  10.81 Fluid Pack  lot # [id number]  . CATARACT EXTRACTION W/PHACO Right 05/14/2017   Procedure: CATARACT EXTRACTION PHACO AND INTRAOCULAR LENS PLACEMENT ([place])  RIGHT;  Surgeon: Leandrew Koyanagi, MD;  Location: [place];  Service: Ophthalmology;  Laterality: Right;  . FRACTURE SURGERY    . left breast excision Left   . MASTECTOMY Bilateral 08/24/2015  . SENTINEL NODE BIOPSY Bilateral 08/24/2015   Procedure: SENTINEL NODE BIOPSY;  Surgeon: Robert Bellow, MD;  Location: [facility] ORS;  Service: General;  Laterality: Bilateral;  . WRIST FRACTURE SURGERY      Medications Prior to Admission medications   Medication Sig Start Date End Date Taking? Authorizing Provider  aspirin 81 MG tablet Take 81 mg by mouth daily.    [provider]  b complex vitamins tablet Take 1 tablet by mouth daily.    [provider]  calcium carbonate (CALCIUM 600) 600 MG TABS tablet Take 1,200 mg by mouth daily with breakfast.     [provider]  Calcium-Magnesium-Vitamin D (CALCIUM 500 PO) Take 1 tablet by mouth daily.    [provider]  Cholecalciferol (VITAMIN D3) 1000 units CAPS Take 2,000 Units by mouth daily.     [provider]  letrozole (FEMARA) 2.5 MG tablet Take 1 tablet (2.5 mg total) by mouth daily. 09/22/18   Robert Bellow, MD  letrozole [facility]) 2.5 MG tablet Take 1 tablet (2.5 mg total) by mouth daily. 09/22/18   Robert Bellow, MD  Omega-3 Fatty Acids (FISH OIL) 1000 MG CAPS Take by mouth.  [provider]  Selenium 200 MCG CAPS Take by mouth.    [provider]    Allergies Bactericin [bacitracin]  Family Hx Family History  Problem Relation Age of Onset  . Cancer Neg Hx     Social Hx Social History   Tobacco Use  . Smoking status: Never Smoker  . Smokeless tobacco: Never Used  Substance Use Topics  . Alcohol use: No    Alcohol/week: 0.0 standard drinks  . Drug use: No     Review of  Systems  Constitutional: Negative for fever. Negative for chills. Eyes: Negative for visual changes. ENT: Negative for sore throat. Cardiovascular: Negative for chest pain. Respiratory: + for shortness of breath. Gastrointestinal: Negative for nausea. Negative for vomiting.  Genitourinary: Negative for dysuria. Musculoskeletal: Negative for leg swelling. Skin: Negative for rash. Neurological: Negative for headaches.   Physical Exam  Vital Signs: ED Triage Vitals  Enc Vitals Group     BP 11/10/19 0949 (!) 157/115     Pulse Rate 11/10/19 0949 (!) 148     Resp 11/10/19 0949 16     Temp 11/10/19 0949 98.2 F (36.8 C)     Temp Source 11/10/19 0949 Oral     SpO2 11/10/19 0949 100 %     Weight 11/10/19 0947 139 lb 15.9 oz (63.5 kg)     Height 11/10/19 0947 5\' 5"  (1.651 m)     Head Circumference --      Peak Flow --      Pain Score 11/10/19 0947 0     Pain Loc --      Pain Edu? --      Excl. in [place]? --     Constitutional: Alert and oriented. Well appearing. NAD.  Head: Normocephalic. Atraumatic. Eyes: Conjunctivae clear. Sclera anicteric. Pupils equal and symmetric. Nose: No masses or lesions. No congestion or rhinorrhea. Mouth/Throat: Wearing mask.  Neck: No stridor. Trachea midline.  Cardiovascular: Tachycardic, irregular. Extremities well perfused. Respiratory: Normal respiratory effort.  Lungs CTAB. No hypoxia.  Gastrointestinal: Soft. Non-distended. Non-tender.  Genitourinary: Deferred. Musculoskeletal: No lower extremity edema. No deformities. Neurologic:  Normal speech and language. No gross focal or lateralizing neurologic deficits are appreciated.  Skin: Skin is warm, dry and intact. No rash noted. Psychiatric: Mood and affect are appropriate for situation.  EKG  Personally reviewed and interpreted by myself.   Date: 11/10/19 Time: 0942 Rate: 150s Rhythm: AF Axis: normal Intervals: WNL AF with RVR No STEMI    Radiology  Personally reviewed available  imaging myself.   CXR - IMPRESSION:  Left lower lobe airspace opacity concerning for pneumonia.   Diffuse interstitial prominence may reflect underlying chronic lung  disease.    Procedures  Procedure(s) performed (including critical care):  .Critical Care Performed by: Lilia Pro., MD Authorized by: Lilia Pro., MD   Critical care provider statement:    Critical care time (minutes):  45   Critical care was time spent personally by me on the following activities:  Discussions with consultants, evaluation of patient's response to treatment, examination of patient, ordering and performing treatments and interventions, ordering and review of laboratory studies, ordering and review of radiographic studies, pulse oximetry, re-evaluation of patient's condition, obtaining history from patient or surrogate and review of old charts  .1-3 Lead EKG Interpretation Performed by: Lilia Pro., MD Authorized by: Lilia Pro., MD     ECG rate assessment: tachycardic     Rhythm: atrial fibrillation   Comments:  Indication: AF with RVR, impression: AF with RVR, rate improved with IV diltiazem, remains in AF     Initial Impression / Assessment and Plan / MDM / ED Course  79 y.o. female who presents to the ED for generalized weakness, difficulty sleeping, mild shortness of breath.  Found on arrival to be in AF with RVR.  No history of the same.  Ddx: symptomatic AF with RVR, infection, electrolyte abnormality, anemia, thyroid disease  Will plan for labs, imaging, urine studies, cardiac monitoring, IV rate control  Clinical Course as of Nov 10 1654  Wed Nov 10, 2019  1206 HR improved with 1 dose of diltiazem.  Labs revealed minimally elevated high-sensitivity troponin at 23, likely in the setting of her RVR, will trend.  Electrolytes and TSH within normal limits.  Mild elevation in AST, ALT, nonspecific.  CXR notable for a left lower lobe pneumonia.  Will opt to treat. Do not  feel her RVR represents tachycardic response to her infection given she is afebrile, normotensive, normal WBC count. However, in the setting of her new onset AF with RVR (now rate controlled) as well as pneumonia, discussed admission with patient - she is very adamant that she does not want to be admitted. She has an appointment with her mastectomy surgeon on Friday and does not want to miss this. She prefers to see her doctor on Friday for her regular visit and second opinion, and then if she needs to be admitted after that she would be agreeable. I discussed the seriousness of both of her new diagnoses. She voices understanding of this. She is agreeable to receiving IV antibiotics and repeat troponin. Will discuss w/ cardiology regarding transition to PO rate control.    [SM]  1231 Discussed case with Dr. Mylo Red, who recommends diltiazem 30 mg Q6H and Eliquis 5 mg twice daily and will follow in the clinic.  HR starting to increase slightly, touching the 110s, will repeat a dose of IV diltiazem and then transition her to oral.  Discussed this regimen with the patient, including risk/benefits of Eliquis.  She is in agreement with starting the diltiazem.  She is agreeable to starting Eliquis after discussing this with her surgeon on Friday.  She has no history of recent falls, head injury, or coagulopathy.   [SM]  1244 Repeat troponin reassuring, down trend to 22.   [SM]  1415 CK within normal limits.  Obtained as patient incidentally had a tourniquet on her LUE for approximately 30 minutes after a blood draw.  No evidence of any neurovascular compromise.  Compartments are soft and compressible, 2+ radial pulse by palpation, cap refill less than 2 seconds, fingers warm and well-perfused.  Motor/sensation intact in radial, ulnar, median distribution.  As such, per patient request, will proceed with discharge.  Rx for antibiotics for treatment of pneumonia, as well as diltiazem and Eliquis per Dr. Melvyn Neth  recommendation provided. Remains HDS and stable from respiratory perspective. Advise close outpatient follow-up and given strict return precautions.   [SM]    Clinical Course User Index [SM] Lilia Pro., MD     _______________________________   As part of my medical decision making I have reviewed available labs, radiology tests, reviewed old records/performed chart review, and discussed with consultants (cardiology).     Final Clinical Impression(s) / ED Diagnosis  Final diagnoses:  Community acquired pneumonia of left lower lobe of lung  New onset atrial fibrillation ([place])       Note:  This document was  prepared using Systems analyst and may include unintentional dictation errors.   Lilia Pro., MD 11/10/19 [phone]

## 2019-11-30 ENCOUNTER — Emergency Department
Admission: EM | Admit: 2019-11-30 | Discharge: 2019-11-30 | Disposition: A | Discharge status: Home / Self Care | Payer: Medicare Other | Source: Home / Self Care | Attending: Emergency Medicine | Admitting: Emergency Medicine

## 2019-11-30 ENCOUNTER — Other Ambulatory Visit: Payer: Self-pay

## 2019-11-30 DIAGNOSIS — I4819 Other persistent atrial fibrillation: Secondary | ICD-10-CM | POA: Diagnosis not present

## 2019-11-30 DIAGNOSIS — I4891 Unspecified atrial fibrillation: Secondary | ICD-10-CM | POA: Diagnosis not present

## 2019-11-30 LAB — BASIC METABOLIC PANEL
Anion gap: 13 (ref 5–15)
BUN: 18 mg/dL (ref 8–23)
CO2: 22 mmol/L (ref 22–32)
Calcium: 8.8 mg/dL — ABNORMAL LOW (ref 8.9–10.3)
Chloride: 99 mmol/L (ref 98–111)
Creatinine, Ser: 0.87 mg/dL (ref 0.44–1.00)
GFR calc Af Amer: >60 mL/min (ref >60)
GFR calc non Af Amer: >60 mL/min (ref >60)
Glucose, Bld: 120 mg/dL — ABNORMAL HIGH (ref 70–99)
Potassium: 3.1 mmol/L — ABNORMAL LOW (ref 3.5–5.1)
Sodium: 134 mmol/L — ABNORMAL LOW (ref 135–145)

## 2019-11-30 LAB — CBC
HCT: 42.6 % (ref 36.0–46.0)
Hemoglobin: 14.5 g/dL (ref 12.0–15.0)
MCH: 30.4 pg (ref 26.0–34.0)
MCHC: 34 g/dL (ref 30.0–36.0)
MCV: 89.3 fL (ref 80.0–100.0)
Platelets: 170 10*3/uL (ref 150–400)
RBC: 4.77 MIL/uL (ref 3.87–5.11)
RDW: 14.4 % (ref 11.5–15.5)
WBC: 5.7 10*3/uL (ref 4.0–10.5)
nRBC: 0 % (ref 0.0–0.2)

## 2019-11-30 LAB — URINALYSIS, COMPLETE (UACMP) WITH MICROSCOPIC
Bacteria, UA: NONE SEEN
Bilirubin Urine: NEGATIVE
Glucose, UA: NEGATIVE mg/dL
Hgb urine dipstick: NEGATIVE
Ketones, ur: 20 mg/dL — AB
Leukocytes,Ua: NEGATIVE
Nitrite: NEGATIVE
Protein, ur: 100 mg/dL — AB
Specific Gravity, Urine: 1.02 (ref 1.005–1.030)
pH: 5 (ref 5.0–8.0)

## 2019-11-30 MED ORDER — METOPROLOL TARTRATE 25 MG PO TABS
25.0000 mg | ORAL_TABLET | Freq: Two times a day (BID) | ORAL | 1 refills | Status: DC
Start: 2019-11-30 — End: 2019-12-03

## 2019-11-30 MED ORDER — POTASSIUM CHLORIDE CRYS ER 20 MEQ PO TBCR
40.0000 meq | EXTENDED_RELEASE_TABLET | Freq: Once | ORAL | Status: AC
  Administered 2019-11-30: 40 meq via ORAL
  Filled 2019-11-30: qty 2

## 2019-11-30 MED ORDER — DILTIAZEM HCL 60 MG PO TABS
30.0000 mg | ORAL_TABLET | Freq: Once | ORAL | Status: DC
  Filled 2019-11-30: qty 1

## 2019-11-30 MED ORDER — METOPROLOL TARTRATE 25 MG PO TABS
25.0000 mg | ORAL_TABLET | Freq: Once | ORAL | Status: AC
  Administered 2019-11-30: 25 mg via ORAL
  Filled 2019-11-30 (×2): qty 1

## 2019-11-30 NOTE — ED Triage Notes (Signed)
Pt comes into the ED via EMS from [place] apartment , pt states the staff there was concerned she didn't seem herself this morning pt is a/ox4 at present. Denies any pain or recent illness. Denies feeling unstable, denies any changes with her gait.

## 2019-11-30 NOTE — ED Notes (Signed)
Left messages w/ both numbers for [facility] to let them know pt was up for discharge.

## 2019-11-30 NOTE — ED Notes (Signed)
Pt arrived from [facility] assisted, Pt states staff called EMS for her because she was "wobbly". Pt states she feels fine and denies being wobbly.

## 2019-11-30 NOTE — ED Triage Notes (Signed)
Pt in via EMS from [facility] with c/o AMS per staff. Staff reports she is more wobbly today than other days. HR 82, 120/84 BP, FSBS 121

## 2019-11-30 NOTE — ED Provider Notes (Signed)
[facility] Emergency Department Provider Note   ____________________________________________   First MD Initiated Contact with Patient 11/30/19 1227     (approximate)  I have reviewed the triage vital signs and the nursing notes.   HISTORY  Chief Complaint Altered Mental Status    HPI Cassie Starch, PhD is a 79 y.o. female with past medical history of breast cancer status post bilateral mastectomy, hypertension, and atrial fibrillation on Eliquis who presents to the ED for reported altered mental status.  She arrived via EMS when staff at [facility] was concerned that she was not acting like her usual self this morning.  They also felt that she was more wobbly than usual when ambulating.  Patient currently denies any complaints, states she feels fine and that they made a mistake in sending her here.  She denies any difficulty walking or feeling wobbly, has not had any vision changes, speech changes, numbness, or weakness.  She denies any fevers, cough, chest pain, shortness of breath, dysuria, or hematuria.  She was recently diagnosed with atrial fibrillation and started on Eliquis as well as diltiazem, states she has not missed any doses recently.        Past Medical History:  Diagnosis Date  . Breast cancer ([place]) 2017   bilateral breast cancer  . Hypertension    OFF MEDS X 6 MONTHS DUE TO CONTROLLED BP (08-15-15)  . Osteoporosis     Patient Active Problem List   Diagnosis Date Noted  . Breast cancer of upper-outer quadrant of left female breast ([place]) 08/18/2015  . 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 ANKLE SURGERY Left 2011  . BILATERAL TOTAL MASTECTOMY WITH AXILLARY LYMPH NODE DISSECTION Bilateral 08/24/2015   Left T2, N56m; ER/PR +; Her 2 neu not overexpressed, Right: DCIS.   Marland Kitchen BREAST BIOPSY Right 08/08/15   stereo/DUCTAL CARCINOMA IN SITU   . BREAST BIOPSY Left 08-15-15   INVASIVE CARCINOMA, dr byrnett  . CATARACT EXTRACTION W/PHACO Left 04/10/2017   Procedure: CATARACT EXTRACTION PHACO AND INTRAOCULAR LENS PLACEMENT (IOC);  Surgeon: Leandrew Koyanagi, MD;  Location: [facility] ORS;  Service: Ophthalmology;  Laterality: Left;  [place] 01:13.3 AP% 14.8 CDE 10.81 Fluid Pack  lot # [id number]  . CATARACT EXTRACTION W/PHACO Right 05/14/2017   Procedure: CATARACT EXTRACTION PHACO AND INTRAOCULAR LENS PLACEMENT ([place])  RIGHT;  Surgeon: Leandrew Koyanagi, MD;  Location: [place];  Service: Ophthalmology;  Laterality: Right;  . FRACTURE SURGERY    . left breast excision Left   . MASTECTOMY Bilateral 08/24/2015  . SENTINEL NODE BIOPSY Bilateral 08/24/2015   Procedure: SENTINEL NODE BIOPSY;  Surgeon: Robert Bellow, MD;  Location: [facility] ORS;  Service: General;  Laterality: Bilateral;  . WRIST FRACTURE SURGERY      Prior to Admission medications   Medication Sig Start Date End Date Taking? Authorizing Provider  apixaban (ELIQUIS) 5 MG TABS tablet Take 1 tablet (5 mg total) by mouth 2 (two) times daily. 11/10/19 12/10/19  Lilia Pro., MD  aspirin 81 MG tablet Take 81 mg by mouth daily.    [provider]  b complex vitamins tablet Take 1 tablet by mouth daily.    [provider]  B Complex-Biotin-FA (SUPER QUINTS B-50) TABS Take by mouth.    [provider]  calcium carbonate (CALCIUM 600) 600 MG TABS tablet Take 1,200 mg by mouth daily with breakfast.     [provider]  Calcium-Magnesium-Vitamin D (CALCIUM 500 PO) Take 1 tablet by mouth daily.    [provider]  Cholecalciferol (VITAMIN D3) 1000 units CAPS Take 2,000 Units by mouth daily.     [provider]  diltiazem (CARDIZEM) 30 MG tablet Take 1 tablet (30 mg total) by mouth every 6 (six) hours. 11/10/19 12/10/19  Lilia Pro., MD  letrozole [facility]) 2.5 MG tablet Take 1 tablet (2.5 mg total) by mouth daily. 09/22/18   Robert Bellow, MD  metoprolol  tartrate (LOPRESSOR) 25 MG tablet Take 1 tablet (25 mg total) by mouth 2 (two) times daily. 11/30/19 01/29/20  Blake Divine, MD  Omega-3 Fatty Acids (FISH OIL) 1000 MG CAPS Take by mouth.    [provider]  Selenium 200 MCG CAPS Take by mouth.    [provider]    Allergies Bactericin [bacitracin]  Family History  Problem Relation Age of Onset  . Cancer Neg Hx     Social History Social History   Tobacco Use  . Smoking status: Never Smoker  . Smokeless tobacco: Never Used  Substance Use Topics  . Alcohol use: No    Alcohol/week: 0.0 standard drinks  . Drug use: No    Review of Systems  Constitutional: No fever/chills. Eyes: No visual changes. ENT: No sore throat. Cardiovascular: Denies chest pain. Respiratory: Denies shortness of breath. Gastrointestinal: No abdominal pain.  No nausea, no vomiting.  No diarrhea.  No constipation. Genitourinary: Negative for dysuria. Musculoskeletal: Negative for back pain. Skin: Negative for rash. Neurological: Negative for headaches, focal weakness or numbness.  Positive for unsteady gait.  ____________________________________________   PHYSICAL EXAM:  VITAL SIGNS: ED Triage Vitals  Enc Vitals Group     BP 11/30/19 1036 124/75     Pulse Rate 11/30/19 1036 76     Resp 11/30/19 1036 18     Temp 11/30/19 1037 98.3 F (36.8 C)     Temp Source 11/30/19 1036 Oral     SpO2 11/30/19 1036 97 %     Weight 11/30/19 1037 130 lb (59 kg)     Height 11/30/19 1037 5\' 5"  (1.651 m)     Head Circumference --      Peak Flow --      Pain Score 11/30/19 1036 0     Pain Loc --      Pain Edu? --      Excl. in [place]? --     Constitutional: Alert and oriented. Eyes: Conjunctivae are normal. Head: Atraumatic. Nose: No congestion/rhinnorhea. Mouth/Throat: Mucous membranes are moist. Neck: Normal ROM Cardiovascular: Normal rate, irregularly irregular rhythm. Grossly normal heart sounds. Respiratory: Normal respiratory  effort.  No retractions. Lungs CTAB. Gastrointestinal: Soft and nontender. No distention. Genitourinary: deferred Musculoskeletal: No lower extremity tenderness nor edema. Neurologic:  Normal speech and language. No gross focal neurologic deficits are appreciated.  Ambulates with steady gait. Skin:  Skin is warm, dry and intact. No rash noted. Psychiatric: Mood and affect are normal. Speech and behavior are normal.  ____________________________________________   LABS (all labs ordered are listed, but only abnormal results are displayed)  Labs Reviewed  BASIC METABOLIC PANEL - Abnormal; Notable for the following components:      Result Value   Sodium 134 (*)    Potassium 3.1 (*)    Glucose, Bld 120 (*)    Calcium 8.8 (*)    All other components within normal limits  URINALYSIS, COMPLETE (UACMP) WITH MICROSCOPIC - Abnormal; Notable for the  following components:   Color, Urine AMBER (*)    APPearance CLOUDY (*)    Ketones, ur 20 (*)    Protein, ur 100 (*)    All other components within normal limits  CBC   ____________________________________________  EKG  ED ECG REPORT I, Blake Divine, the attending physician, personally viewed and interpreted this ECG.   Date: 11/30/2019  EKG Time: 13:32  Rate: 106  Rhythm: atrial fibrillation, rate 106  Axis: LAD  Intervals:none  ST&T Change: None   PROCEDURES  Procedure(s) performed (including Critical Care):  Procedures   ____________________________________________   INITIAL IMPRESSION / ASSESSMENT AND PLAN / ED COURSE       79 year old female with history of atrial fibrillation, hypertension, and breast cancer status post bilateral mastectomy who presents to the ED after staff at [facility] were concerned she was not acting like her usual self and had an unsteady gait.  Patient herself denies any complaints and states they made a mistake in sending her here.  She does not appear to have any focal neurologic deficits and  ambulates with a steady gait, is also alert and oriented x3.  No findings to suggest a stroke.  Blood work is remarkable only for mild hypokalemia, which we will replete.  UA is also unremarkable.  She was noted to be in atrial fibrillation but with a mildly elevated rate, has a known history of atrial fibrillation for which she follows with her PCP.  Case discussed with Dr. Nehemiah Massed of cardiology, who recommends adding 25 mg of metoprolol twice daily to her current regimen.  We will give initial dose here in the ED and monitor for improvement in heart rate.  Follow-up to be arranged with cardiology later this week.  Patient with improvement in heart rate following dose of metoprolol, we will prescribe this for home use.  She was counseled to return to the ED for new worsening symptoms, patient agrees with plan.      ____________________________________________   FINAL CLINICAL IMPRESSION(S) / ED DIAGNOSES  Final diagnoses:  Atrial fibrillation, unspecified type [facility])     ED Discharge Orders         Ordered    metoprolol tartrate (LOPRESSOR) 25 MG tablet  2 times daily     11/30/19 1543           Note:  This document was prepared using Dragon voice recognition software and may include unintentional dictation errors.   Blake Divine, MD 11/30/19 [phone]

## 2019-11-30 NOTE — ED Notes (Signed)
Pt provided w/ turkey sandwich box.  

## 2019-11-30 NOTE — ED Notes (Signed)
Gave DC instructions to [employer] for [employer] at [place].

## 2019-11-30 NOTE — ED Notes (Signed)
Placed pt in transportation from [facility] at [place], pt had DC instructions in her possession.

## 2019-12-01 ENCOUNTER — Other Ambulatory Visit: Payer: Self-pay

## 2019-12-01 ENCOUNTER — Emergency Department: Payer: Medicare Other

## 2019-12-01 ENCOUNTER — Inpatient Hospital Stay
Admission: EM | Admit: 2019-12-01 | Discharge: 2019-12-03 | DRG: 308 | Disposition: A | Discharge status: Skilled Nursing Facility | Payer: Medicare Other | Source: Skilled Nursing Facility | Attending: Internal Medicine | Admitting: Internal Medicine

## 2019-12-01 DIAGNOSIS — Z853 Personal history of malignant neoplasm of breast: Secondary | ICD-10-CM | POA: Diagnosis not present

## 2019-12-01 DIAGNOSIS — Z7901 Long term (current) use of anticoagulants: Secondary | ICD-10-CM

## 2019-12-01 DIAGNOSIS — Z79899 Other long term (current) drug therapy: Secondary | ICD-10-CM

## 2019-12-01 DIAGNOSIS — G250 Essential tremor: Secondary | ICD-10-CM | POA: Diagnosis present

## 2019-12-01 DIAGNOSIS — R410 Disorientation, unspecified: Secondary | ICD-10-CM | POA: Diagnosis not present

## 2019-12-01 DIAGNOSIS — I10 Essential (primary) hypertension: Secondary | ICD-10-CM

## 2019-12-01 DIAGNOSIS — M810 Age-related osteoporosis without current pathological fracture: Secondary | ICD-10-CM | POA: Diagnosis present

## 2019-12-01 DIAGNOSIS — I4819 Other persistent atrial fibrillation: Principal | ICD-10-CM | POA: Diagnosis present

## 2019-12-01 DIAGNOSIS — I4891 Unspecified atrial fibrillation: Secondary | ICD-10-CM

## 2019-12-01 DIAGNOSIS — Z882 Allergy status to sulfonamides status: Secondary | ICD-10-CM

## 2019-12-01 DIAGNOSIS — Z7982 Long term (current) use of aspirin: Secondary | ICD-10-CM

## 2019-12-01 DIAGNOSIS — G9341 Metabolic encephalopathy: Secondary | ICD-10-CM | POA: Diagnosis present

## 2019-12-01 DIAGNOSIS — T447X5A Adverse effect of beta-adrenoreceptor antagonists, initial encounter: Secondary | ICD-10-CM | POA: Diagnosis present

## 2019-12-01 DIAGNOSIS — R4182 Altered mental status, unspecified: Secondary | ICD-10-CM | POA: Diagnosis present

## 2019-12-01 DIAGNOSIS — Z9013 Acquired absence of bilateral breasts and nipples: Secondary | ICD-10-CM

## 2019-12-01 DIAGNOSIS — F0390 Unspecified dementia without behavioral disturbance: Secondary | ICD-10-CM | POA: Diagnosis present

## 2019-12-01 DIAGNOSIS — J189 Pneumonia, unspecified organism: Secondary | ICD-10-CM | POA: Diagnosis present

## 2019-12-01 DIAGNOSIS — Z20822 Contact with and (suspected) exposure to covid-19: Secondary | ICD-10-CM | POA: Diagnosis present

## 2019-12-01 DIAGNOSIS — Z79811 Long term (current) use of aromatase inhibitors: Secondary | ICD-10-CM

## 2019-12-01 HISTORY — DX: Unspecified atrial fibrillation: I48.91

## 2019-12-01 LAB — CBC WITH DIFFERENTIAL/PLATELET
Abs Immature Granulocytes: 0.02 10*3/uL (ref 0.00–0.07)
Basophils Absolute: 0 10*3/uL (ref 0.0–0.1)
Basophils Relative: 1 %
Eosinophils Absolute: 0 10*3/uL (ref 0.0–0.5)
Eosinophils Relative: 1 %
HCT: 42.3 % (ref 36.0–46.0)
Hemoglobin: 14.5 g/dL (ref 12.0–15.0)
Immature Granulocytes: 0 %
Lymphocytes Relative: 5 %
Lymphs Abs: 0.3 10*3/uL — ABNORMAL LOW (ref 0.7–4.0)
MCH: 30.3 pg (ref 26.0–34.0)
MCHC: 34.3 g/dL (ref 30.0–36.0)
MCV: 88.3 fL (ref 80.0–100.0)
Monocytes Absolute: 0.4 10*3/uL (ref 0.1–1.0)
Monocytes Relative: 7 %
Neutro Abs: 5 10*3/uL (ref 1.7–7.7)
Neutrophils Relative %: 86 %
Platelets: 175 10*3/uL (ref 150–400)
RBC: 4.79 MIL/uL (ref 3.87–5.11)
RDW: 14.3 % (ref 11.5–15.5)
WBC: 5.7 10*3/uL (ref 4.0–10.5)
nRBC: 0 % (ref 0.0–0.2)

## 2019-12-01 LAB — BASIC METABOLIC PANEL
Anion gap: 13 (ref 5–15)
BUN: 33 mg/dL — ABNORMAL HIGH (ref 8–23)
CO2: 22 mmol/L (ref 22–32)
Calcium: 8.9 mg/dL (ref 8.9–10.3)
Chloride: 99 mmol/L (ref 98–111)
Creatinine, Ser: 0.92 mg/dL (ref 0.44–1.00)
GFR calc Af Amer: >60 mL/min (ref >60)
GFR calc non Af Amer: 58 mL/min — ABNORMAL LOW (ref >60)
Glucose, Bld: 109 mg/dL — ABNORMAL HIGH (ref 70–99)
Potassium: 3.7 mmol/L (ref 3.5–5.1)
Sodium: 134 mmol/L — ABNORMAL LOW (ref 135–145)

## 2019-12-01 LAB — URINALYSIS, COMPLETE (UACMP) WITH MICROSCOPIC
Bacteria, UA: NONE SEEN
Bilirubin Urine: NEGATIVE
Glucose, UA: NEGATIVE mg/dL
Hgb urine dipstick: NEGATIVE
Ketones, ur: 80 mg/dL — AB
Leukocytes,Ua: NEGATIVE
Nitrite: NEGATIVE
Protein, ur: 100 mg/dL — AB
Specific Gravity, Urine: 1.02 (ref 1.005–1.030)
Squamous Epithelial / HPF: NONE SEEN (ref 0–5)
pH: 5 (ref 5.0–8.0)

## 2019-12-01 LAB — TROPONIN I (HIGH SENSITIVITY)
Troponin I (High Sensitivity): 50 ng/L — ABNORMAL HIGH (ref <18)
Troponin I (High Sensitivity): 44 ng/L — ABNORMAL HIGH (ref <18)

## 2019-12-01 LAB — SARS CORONAVIRUS 2 BY RT PCR (HOSPITAL ORDER, PERFORMED IN ~~LOC~~ HOSPITAL LAB): SARS Coronavirus 2: NEGATIVE

## 2019-12-01 MED ORDER — METOPROLOL TARTRATE 25 MG PO TABS
25.0000 mg | ORAL_TABLET | Freq: Two times a day (BID) | ORAL | Status: DC
  Administered 2019-12-01: 25 mg via ORAL
  Filled 2019-12-01: qty 1

## 2019-12-01 MED ORDER — LETROZOLE 2.5 MG PO TABS
2.5000 mg | ORAL_TABLET | Freq: Every day | ORAL | Status: DC
  Administered 2019-12-02 – 2019-12-03 (×2): 2.5 mg via ORAL
  Filled 2019-12-01 (×2): qty 1

## 2019-12-01 MED ORDER — DILTIAZEM HCL 25 MG/5ML IV SOLN
5.0000 mg | Freq: Once | INTRAVENOUS | Status: AC
  Administered 2019-12-01: 5 mg via INTRAVENOUS
  Filled 2019-12-01: qty 5

## 2019-12-01 MED ORDER — DILTIAZEM HCL 25 MG/5ML IV SOLN
5.0000 mg | Freq: Once | INTRAVENOUS | Status: AC
  Administered 2019-12-01: 5 mg via INTRAVENOUS

## 2019-12-01 MED ORDER — APIXABAN 2.5 MG PO TABS
2.5000 mg | ORAL_TABLET | Freq: Two times a day (BID) | ORAL | Status: DC
  Administered 2019-12-02 – 2019-12-03 (×4): 2.5 mg via ORAL
  Filled 2019-12-01 (×5): qty 1

## 2019-12-01 NOTE — ED Notes (Addendum)
Pt assisted up to bedside toilet to try for urine sample. Pt steady with this RN walking next to her. Denies dizziness.

## 2019-12-01 NOTE — ED Notes (Addendum)
First nurse note: Sp[facility] at [facility] who states pt was seen here yesterday and d/c. Pt is not here baseline and is altered. Pt was found naked in the floor today. Pt unable to answer questions with staff. BG 113, temp 99.8, Pt had labs done yesterday but no ct. Pt was dx with a fib recently and has gone down ever since per staff.

## 2019-12-01 NOTE — ED Notes (Addendum)
Provider Tu at bedside. Verbal okay to give pt OJ. Given.

## 2019-12-01 NOTE — ED Notes (Addendum)
Pt thinks she is at a "doctor's office" and is in "[place]"; stated it is "2022" but also stated it is "May"; knows self and situation/reason for needing medical care. A&Ox4. Pt reports that she takes a BP med and checks her BM every morning.

## 2019-12-01 NOTE — H&P (Signed)
History and Physical    Cassie Starch, PhD [id number] DOB: 13-Aug-1940 DOA: 12/01/2019  PCP: Kirk Ruths, MD  Patient coming from: [place]  I have personally briefly reviewed patient's old medical records in [place]  Chief Complaint: Altered mental status  HPI: [name], PhD is a 79 y.o. female with medical history significant for hypertension, history of breast cancer s/p bilateral mastectomy in 2017, recent diagnosis of atrial fibrillation on Eliquis who was sent by her facility with concerns of altered mental status.   Reportedly patient was found naked in her room today and appeared confused.  She was actually sent for similar symptoms yesterday to the ED although it was documented that she might have been sent by mistake.  She was noted to be in slight atrial fibrillation with RVR and cardiology was consulted at that time and added metoprolol 25 mg twice daily to her regimen.  Patient does not recall starting this medication.  States that she normally manages her own medication the facility.  She is unable to recall events of her day to me although she "does not believe that she was found naked."  She was alert and oriented only to self.  She denies any chest pain, shortness of breath or palpitation.  No nausea, vomiting, constipation or diarrhea.  States she has good appetite at the facility.  ED Course: She was afebrile, normotensive and had nonsustained atrial fibrillation with RVR.  Her rates ranged from 90s up to 150s with exertion. CBC was unremarkable.  Sodium of 134, glucose of 109 and normal creatinine.  Troponin of 50 and then 44. Negative UA.  CT head shows no acute findings although there is nonspecific sclerosis and metastasis is not excluded due to her hx of breast cancer.   Review of Systems: Unable to fully obtain due to patient's altered mental status  Past Medical History:  Diagnosis Date   Atrial fibrillation ([place])     Breast cancer ([place]) 2017   bilateral breast cancer   Hypertension    OFF MEDS X 6 MONTHS DUE TO CONTROLLED BP (08-15-15)   Osteoporosis     Past Surgical History:  Procedure Laterality Date   ANKLE SURGERY Left 2011   BILATERAL TOTAL MASTECTOMY WITH AXILLARY LYMPH NODE DISSECTION Bilateral 08/24/2015   Left T2, N56m; ER/PR +; Her 2 neu not overexpressed, Right: DCIS.    BREAST BIOPSY Right 08/08/15   stereo/DUCTAL CARCINOMA IN SITU    BREAST BIOPSY Left 08-15-15   INVASIVE CARCINOMA, dr byrnett   CATARACT EXTRACTION W/PHACO Left 04/10/2017   Procedure: CATARACT EXTRACTION PHACO AND INTRAOCULAR LENS PLACEMENT (IOC);  Surgeon: Leandrew Koyanagi, MD;  Location: [facility];  Service: Ophthalmology;  Laterality: Left;  [place] 01:13.3 AP% 14.8 CDE 10.81 Fluid Pack  lot # [id number]   CATARACT EXTRACTION W/PHACO Right 05/14/2017   Procedure: CATARACT EXTRACTION PHACO AND INTRAOCULAR LENS PLACEMENT ([place])  RIGHT;  Surgeon: Leandrew Koyanagi, MD;  Location: [place];  Service: Ophthalmology;  Laterality: Right;   FRACTURE SURGERY     left breast excision Left    MASTECTOMY Bilateral 08/24/2015   SENTINEL NODE BIOPSY Bilateral 08/24/2015   Procedure: SENTINEL NODE BIOPSY;  Surgeon: Robert Bellow, MD;  Location: [facility];  Service: General;  Laterality: Bilateral;   WRIST FRACTURE SURGERY       reports that she has never smoked. She has never used smokeless tobacco. She reports that she does not drink alcohol or use drugs.  Allergies  Allergen Reactions   Bactericin [Bacitracin] Rash    bactrim    Family History  Problem Relation Age of Onset   Cancer Neg Hx      Prior to Admission medications   Medication Sig Start Date End Date Taking? Authorizing Provider  apixaban (ELIQUIS) 5 MG TABS tablet Take 1 tablet (5 mg total) by mouth 2 (two) times daily. 11/10/19 12/10/19  Lilia Pro., MD  aspirin 81 MG tablet Take 81 mg by mouth daily.    [provider]    b complex vitamins tablet Take 1 tablet by mouth daily.    [provider]  B Complex-Biotin-FA (SUPER QUINTS B-50) TABS Take by mouth.    [provider]  calcium carbonate (CALCIUM 600) 600 MG TABS tablet Take 1,200 mg by mouth daily with breakfast.     [provider]  Calcium-Magnesium-Vitamin D (CALCIUM 500 PO) Take 1 tablet by mouth daily.    [provider]  Cholecalciferol (VITAMIN D3) 1000 units CAPS Take 2,000 Units by mouth daily.     [provider]  diltiazem (CARDIZEM) 30 MG tablet Take 1 tablet (30 mg total) by mouth every 6 (six) hours. 11/10/19 12/10/19  Lilia Pro., MD  letrozole [facility]) 2.5 MG tablet Take 1 tablet (2.5 mg total) by mouth daily. 09/22/18   Robert Bellow, MD  metoprolol tartrate (LOPRESSOR) 25 MG tablet Take 1 tablet (25 mg total) by mouth 2 (two) times daily. 11/30/19 01/29/20  Blake Divine, MD  Omega-3 Fatty Acids (FISH OIL) 1000 MG CAPS Take by mouth.    [provider]  Selenium 200 MCG CAPS Take by mouth.    [provider]    Physical Exam: Vitals:   12/01/19 2030 12/01/19 2045 12/01/19 2100 12/01/19 2115  BP: 130/80  134/77   Pulse: 65 94 69 (!) 108  Resp: (!) 23 (!) 22  (!) 25  Temp:      TempSrc:      SpO2: 97% 98% 95% 97%  Weight:      Height:        Constitutional: NAD, calm, comfortable, elderly nontoxic appearing female lying flat in bed Vitals:   12/01/19 2030 12/01/19 2045 12/01/19 2100 12/01/19 2115  BP: 130/80  134/77   Pulse: 65 94 69 (!) 108  Resp: (!) 23 (!) 22  (!) 25  Temp:      TempSrc:      SpO2: 97% 98% 95% 97%  Weight:      Height:       Eyes: PERRL, lids and conjunctivae normal ENMT: Mucous membranes are moist.  Neck: normal, supple Respiratory: clear to auscultation bilaterally, no wheezing, no crackles. Normal respiratory effort. No accessory muscle use.  Cardiovascular: Regular rate and rhythm, no murmurs / rubs / gallops. No extremity  edema. 2+ pedal pulses.   Abdomen: no tenderness, no masses palpated.. Bowel sounds positive.  Musculoskeletal: no clubbing / cyanosis. No joint deformity upper and lower extremities. Good ROM, no contractures. Normal muscle tone.  Skin: no rashes, lesions, ulcers. No induration Neurologic: Alert and oriented only to self.  Patient had slow responses to questions.  CN 2-12 grossly intact. Sensation intact,Strength 5/5 in all 4.  Intact finger-nose.  Able to name simple objects and follow commands. Psychiatric: Alert and oriented only to self.  Pleasant mood.  Labs on Admission: I have personally reviewed following labs and imaging studies  CBC: Recent Labs  Lab 11/30/19 1050 12/01/19 1925  WBC 5.7 5.7  NEUTROABS  --  5.0  HGB 14.5 14.5  HCT 42.6 42.3  MCV 89.3 88.3  PLT 170 0000000   Basic Metabolic Panel: Recent Labs  Lab 11/30/19 1050 12/01/19 1925  NA 134* 134*  K 3.1* 3.7  CL 99 99  CO2 22 22  GLUCOSE 120* 109*  BUN 18 33*  CREATININE 0.87 0.92  CALCIUM 8.8* 8.9   GFR: Estimated Creatinine Clearance: 42.3 mL/min (by C-G formula based on SCr of 0.92 mg/dL). Liver Function Tests: No results for input(s): AST, ALT, ALKPHOS, BILITOT, PROT, ALBUMIN in the last 168 hours. No results for input(s): LIPASE, AMYLASE in the last 168 hours. No results for input(s): AMMONIA in the last 168 hours. Coagulation Profile: No results for input(s): INR, PROTIME in the last 168 hours. Cardiac Enzymes: No results for input(s): CKTOTAL, CKMB, CKMBINDEX, TROPONINI in the last 168 hours. BNP (last 3 results) No results for input(s): PROBNP in the last 8760 hours. HbA1C: No results for input(s): HGBA1C in the last 72 hours. CBG: No results for input(s): GLUCAP in the last 168 hours. Lipid Profile: No results for input(s): CHOL, HDL, LDLCALC, TRIG, CHOLHDL, LDLDIRECT in the last 72 hours. Thyroid Function Tests: No results for input(s): TSH, T4TOTAL, FREET4, T3FREE, THYROIDAB in the last  72 hours. Anemia Panel: No results for input(s): VITAMINB12, FOLATE, FERRITIN, TIBC, IRON, RETICCTPCT in the last 72 hours. Urine analysis:    Component Value Date/Time   COLORURINE AMBER (A) 12/01/2019 1925   APPEARANCEUR CLOUDY (A) 12/01/2019 1925   LABSPEC 1.020 12/01/2019 1925   PHURINE 5.0 12/01/2019 1925   GLUCOSEU NEGATIVE 12/01/2019 1925   HGBUR NEGATIVE 12/01/2019 1925   BILIRUBINUR NEGATIVE 12/01/2019 1925   KETONESUR 80 (A) 12/01/2019 1925   PROTEINUR 100 (A) 12/01/2019 1925   NITRITE NEGATIVE 12/01/2019 1925   LEUKOCYTESUR NEGATIVE 12/01/2019 1925    Radiological Exams on Admission: CT Head Wo Contrast  Result Date: 12/01/2019 CLINICAL DATA:  Altered mental status EXAM: CT HEAD WITHOUT CONTRAST TECHNIQUE: Contiguous axial images were obtained from the base of the skull through the vertex without intravenous contrast. COMPARISON:  None. FINDINGS: Brain: There is no acute intracranial hemorrhage, mass effect, or edema. Gray-white differentiation is preserved. There is no extra-axial fluid collection. Prominence of the ventricles and sulci reflects minor generalized parenchymal volume loss. Patchy and confluent areas of hypoattenuation in the supratentorial white matter nonspecific but probably reflect moderate chronic microvascular ischemic changes. Vascular: No hyperdense vessel or unexpected calcification. Skull: Nonspecific focus of sclerosis within the right parietal calvarium. Sinuses/Orbits: Minimal retained secretions in the right sphenoid sinus. Orbits are unremarkable. Other: None. IMPRESSION: No acute intracranial hemorrhage, mass effect, or evidence of acute infarction. Chronic microvascular ischemic changes. Nonspecific sclerosis within the right parietal calvarium. A sclerotic metastasis is not excluded in this patient with history of breast cancer. Electronically Signed   By: Macy Mis M.D.   On: 12/01/2019 16:17      Assessment/Plan  AMS Unclear etiology.  Unsure how long patient could have had cognitive decline. She is only oriented to self at this point. Unclear of her recent baseline Will check TSH CT head negative for acute findings but has non-specific sclerosis that could be metastasis given her hx of breast cancer. Could consider MRI brain if she shows no improvement.   Atrial fibrillation with RVR  Nonsustained. She received 2 doses of 5 mg diltiazem IV in the ED. She was started on metoprolol 25 mg twice daily after evaluation  in the ED and does not recall taking this medication.  Will resume tonight. Follow closely on telemetry Continue Eliquis  History of breast cancer s/p bilateral mastectomy Continue letrozole  Hypertension Stable  DVT prophylaxis: Eliquis Code Status: Full Family Communication: Plan discussed with patient at bedside  disposition Plan: [place] with observation Consults called:  Admission status: Observation  Status is: Observation  The patient remains OBS appropriate and will d/c before 2 midnights.  Dispo: The patient is from: SNF              Anticipated d/c is to: SNF              Anticipated d/c date is: 1 day              Patient currently is not medically stable to d/c.         Orene Desanctis DO [facility]   If 7PM-7AM, please contact night-coverage [email or web address]   12/01/2019, 10:00 PM

## 2019-12-01 NOTE — ED Notes (Signed)
Used teach-back method to confirm pt understands where call bell is and how to use it to get this RN. Pt given another cup of OJ. Lights dimmed for pt. Bed locked low. Rails up. Call bell within reach.

## 2019-12-01 NOTE — ED Provider Notes (Signed)
[facility] Emergency Department Provider Note ____________________________________________   First MD Initiated Contact with Patient 12/01/19 1849     (approximate)  I have reviewed the triage vital signs and the nursing notes.   HISTORY  Chief Complaint Altered Mental Status  Level 5 caveat: History of present illness limited due to altered mental status  HPI Cassie Starch, PhD is a 79 y.o. female with PMH as noted below who presents with concern for altered mental status.  She was found at her assisted living facility on the floor naked.  She has apparently been confused for the last couple of days, and was seen in the ED for this yesterday.  The patient herself denies any acute complaints.  She states she is not sure why she is here and denies any fall or that anyone found her on the floor.  Past Medical History:  Diagnosis Date  . Atrial fibrillation ([place])   . Breast cancer ([place]) 2017   bilateral breast cancer  . Hypertension    OFF MEDS X 6 MONTHS DUE TO CONTROLLED BP (08-15-15)  . Osteoporosis     Patient Active Problem List   Diagnosis Date Noted  . Breast cancer of upper-outer quadrant of left female breast ([place]) 08/18/2015  . 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 ANKLE SURGERY Left 2011  . BILATERAL TOTAL MASTECTOMY WITH AXILLARY LYMPH NODE DISSECTION Bilateral 08/24/2015   Left T2, N60m; ER/PR +; Her 2 neu not overexpressed, Right: DCIS.   Marland Kitchen BREAST BIOPSY Right 08/08/15   stereo/DUCTAL CARCINOMA IN SITU   . BREAST BIOPSY Left 08-15-15   INVASIVE CARCINOMA, dr byrnett  . CATARACT EXTRACTION W/PHACO Left 04/10/2017   Procedure: CATARACT EXTRACTION PHACO AND INTRAOCULAR LENS PLACEMENT (IOC);  Surgeon: Leandrew Koyanagi, MD;  Location: [facility];  Service: Ophthalmology;  Laterality: Left;  [place] 01:13.3 AP% 14.8 CDE 10.81 Fluid Pack  lot # [id number]  . CATARACT EXTRACTION  W/PHACO Right 05/14/2017   Procedure: CATARACT EXTRACTION PHACO AND INTRAOCULAR LENS PLACEMENT ([place])  RIGHT;  Surgeon: Leandrew Koyanagi, MD;  Location: [place];  Service: Ophthalmology;  Laterality: Right;  . FRACTURE SURGERY    . left breast excision Left   . MASTECTOMY Bilateral 08/24/2015  . SENTINEL NODE BIOPSY Bilateral 08/24/2015   Procedure: SENTINEL NODE BIOPSY;  Surgeon: Robert Bellow, MD;  Location: [facility];  Service: General;  Laterality: Bilateral;  . WRIST FRACTURE SURGERY      Prior to Admission medications   Medication Sig Start Date End Date Taking? Authorizing Provider  apixaban (ELIQUIS) 5 MG TABS tablet Take 1 tablet (5 mg total) by mouth 2 (two) times daily. 11/10/19 12/10/19  Lilia Pro., MD  aspirin 81 MG tablet Take 81 mg by mouth daily.    [provider]  b complex vitamins tablet Take 1 tablet by mouth daily.    [provider]  B Complex-Biotin-FA (SUPER QUINTS B-50) TABS Take by mouth.    [provider]  calcium carbonate (CALCIUM 600) 600 MG TABS tablet Take 1,200 mg by mouth daily with breakfast.     [provider]  Calcium-Magnesium-Vitamin D (CALCIUM 500 PO) Take 1 tablet by mouth daily.    [provider]  Cholecalciferol (VITAMIN D3) 1000 units CAPS Take 2,000 Units by mouth daily.     [provider]  diltiazem (CARDIZEM) 30 MG tablet Take 1 tablet (30 mg  total) by mouth every 6 (six) hours. 11/10/19 12/10/19  Lilia Pro., MD  letrozole [facility]) 2.5 MG tablet Take 1 tablet (2.5 mg total) by mouth daily. 09/22/18   Robert Bellow, MD  metoprolol tartrate (LOPRESSOR) 25 MG tablet Take 1 tablet (25 mg total) by mouth 2 (two) times daily. 11/30/19 01/29/20  Blake Divine, MD  Omega-3 Fatty Acids (FISH OIL) 1000 MG CAPS Take by mouth.    [provider]  Selenium 200 MCG CAPS Take by mouth.    [provider]    Allergies Bactericin [bacitracin]  Family History   Problem Relation Age of Onset  . Cancer Neg Hx     Social History Social History   Tobacco Use  . Smoking status: Never Smoker  . Smokeless tobacco: Never Used  Substance Use Topics  . Alcohol use: No    Alcohol/week: 0.0 standard drinks  . Drug use: No    Review of Systems All 5 caveat: Unable to obtain reliable review of systems due to possible altered mental status    ____________________________________________   PHYSICAL EXAM:  VITAL SIGNS: ED Triage Vitals  Enc Vitals Group     BP 12/01/19 1622 109/79     Pulse Rate 12/01/19 1622 (!) 59     Resp 12/01/19 1622 18     Temp 12/01/19 1622 98.3 F (36.8 C)     Temp Source 12/01/19 1622 Oral     SpO2 12/01/19 1622 97 %     Weight 12/01/19 1554 123 lb 3.8 oz (55.9 kg)     Height 12/01/19 1554 5\' 5"  (1.651 m)     Head Circumference --      Peak Flow --      Pain Score 12/01/19 1554 0     Pain Loc --      Pain Edu? --      Excl. in [place]? --     Constitutional: Alert, oriented x2.  Relatively well appearing and in no acute distress. Eyes: Conjunctivae are normal.  EOMI.  PERRLA. Head: Atraumatic. Nose: No congestion/rhinnorhea. Mouth/Throat: Mucous membranes are moist.   Neck: Normal range of motion.  Cardiovascular: Tachycardic, irregular rhythm. Grossly normal heart sounds.  Good peripheral circulation. Respiratory: Normal respiratory effort.  No retractions. Lungs CTAB. Gastrointestinal: Soft and nontender. No distention.  Genitourinary: No flank tenderness. Musculoskeletal: No lower extremity edema.  Extremities warm and well perfused.  Neurologic:  Normal speech and language.  Motor intact in all extremities.  Normal coordination. Skin:  Skin is warm and dry. No rash noted. Psychiatric: Mood and affect are normal. Speech and behavior are normal.  ____________________________________________   LABS (all labs ordered are listed, but only abnormal results are displayed)  Labs Reviewed  BASIC METABOLIC  PANEL - Abnormal; Notable for the following components:      Result Value   Sodium 134 (*)    Glucose, Bld 109 (*)    BUN 33 (*)    GFR calc non Af Amer 58 (*)    All other components within normal limits  CBC WITH DIFFERENTIAL/PLATELET - Abnormal; Notable for the following components:   Lymphs Abs 0.3 (*)    All other components within normal limits  URINALYSIS, COMPLETE (UACMP) WITH MICROSCOPIC - Abnormal; Notable for the following components:   Color, Urine AMBER (*)    APPearance CLOUDY (*)    Ketones, ur 80 (*)    Protein, ur 100 (*)    All other components within normal limits  TROPONIN I (HIGH SENSITIVITY) - Abnormal; Notable for the following components:   Troponin I (High Sensitivity) 50 (*)    All other components within normal limits  SARS CORONAVIRUS 2 BY RT PCR (HOSPITAL ORDER, [place] [facility])  TROPONIN I (HIGH SENSITIVITY)   ____________________________________________  EKG  ED ECG REPORT I, Arta Silence, the attending physician, personally viewed and interpreted this ECG.  Date: 12/01/2019 EKG Time: 1619 Rate: 120 Rhythm: Atrial fibrillation with RVR QRS Axis: normal Intervals: normal ST/T Wave abnormalities: normal Narrative Interpretation: no evidence of acute ischemia  ____________________________________________  RADIOLOGY  CT head: No ICH or other acute abnormality.  Nonspecific sclerosis right parietal calvarium.  ____________________________________________   PROCEDURES  Procedure(s) performed: No  Procedures  Critical Care performed: No ____________________________________________   INITIAL IMPRESSION / ASSESSMENT AND PLAN / ED COURSE  Pertinent labs & imaging results that were available during my care of the patient were reviewed by me and considered in my medical decision making (see chart for details).  79 year old female with PMH as noted above including atrial fibrillation on Eliquis and Cardizem  presents with concern for possible altered mental status after apparently being found on the floor of her assisted living facility.  The patient herself denies any acute complaints, and denies that she was on the floor.  I reviewed the past medical records in epic.  The patient was seen in the ED yesterday with a similar presentation.  She also denied any acute symptoms at that time.  She had no imaging, but lab work-up was unremarkable.  On exam currently she is alert and oriented x2 with a nonfocal neurologic exam.  Vital signs are normal except that she is in rapid atrial fibrillation with rate anywhere from 110-150.  Physical exam is otherwise unremarkable.  There is no visible trauma.  Based on exam, it is not totally clear whether the patient is significantly altered from her baseline, although she does not remember falling or being on the floor.  CT head is negative for acute traumatic findings.  Differential includes symptoms related to her atrial fibrillation, dehydration or other metabolic abnormality, UTI or other infection, although some of this was effectively ruled out on her work-up yesterday.  I have ordered IV Cardizem for rate control, and we will obtain basic labs, troponin, and urinalysis for reevaluation.  ----------------------------------------- 8:55 PM on 12/01/2019 -----------------------------------------  The patient remains in rapid atrial fibrillation.  Initial troponin is somewhat elevated.  Given these findings I recommended admission.  The patient states that she does not want to stay in the hospital.  I attempted to have a detailed discussion with her about the results of the work-up, my concern for possible MI, worsening atrial fibrillation, syncope, and death.  The patient understands I am concerned she may die, but does not demonstrate sufficient understanding of her current condition or risks.  She claims that the unit director of her facility wanted to send her to  the hospital, but states different reasons for this, and does appear confused.  She still does not remember anything about what happened today.  She also cannot identify any specific concerns she has about being admitted to the hospital.    With her permission, I called her half sister Opal Sidles who the patient states is her healthcare proxy/power of attorney.  She has spoken to her several times recently and also noted that she seemed somewhat confused, and states that the patient sent her an email few days ago which  was not coherent.  Overall at this time, the patient does not demonstrate adequate decision-making capacity at this time to decline care or sign out AMA.  I discussed her case with hospitalist Dr. Flossie Buffy for admission. ____________________________________________   FINAL CLINICAL IMPRESSION(S) / ED DIAGNOSES  Final diagnoses:  Altered mental status, unspecified altered mental status type  Atrial fibrillation with rapid ventricular response (HCC)      NEW MEDICATIONS STARTED DURING THIS VISIT:  New Prescriptions   No medications on file     Note:  This document was prepared using Dragon voice recognition software and may include unintentional dictation errors.    Arta Silence, MD 12/01/19 2101

## 2019-12-01 NOTE — ED Notes (Signed)
Pt urinated 100cc. Dark amber.

## 2019-12-01 NOTE — ED Triage Notes (Addendum)
Pt arrives via [facility] from [facility] after patient was found in the floor naked. Per staff, patient has not been at baseline, seen yesterday for the same. Pt denies falling today. Pt A&Ox3, disoriented to situation. Speech clear.

## 2019-12-02 ENCOUNTER — Observation Stay (HOSPITAL_COMMUNITY)
Admit: 2019-12-02 | Discharge: 2019-12-02 | Disposition: A | Discharge status: Home / Self Care | Payer: Medicare Other | Attending: Internal Medicine | Admitting: Internal Medicine

## 2019-12-02 DIAGNOSIS — Z7982 Long term (current) use of aspirin: Secondary | ICD-10-CM | POA: Diagnosis not present

## 2019-12-02 DIAGNOSIS — G9341 Metabolic encephalopathy: Secondary | ICD-10-CM | POA: Diagnosis present

## 2019-12-02 DIAGNOSIS — I10 Essential (primary) hypertension: Secondary | ICD-10-CM | POA: Diagnosis present

## 2019-12-02 DIAGNOSIS — Z853 Personal history of malignant neoplasm of breast: Secondary | ICD-10-CM | POA: Diagnosis not present

## 2019-12-02 DIAGNOSIS — G250 Essential tremor: Secondary | ICD-10-CM | POA: Diagnosis present

## 2019-12-02 DIAGNOSIS — M810 Age-related osteoporosis without current pathological fracture: Secondary | ICD-10-CM | POA: Diagnosis present

## 2019-12-02 DIAGNOSIS — I4891 Unspecified atrial fibrillation: Secondary | ICD-10-CM

## 2019-12-02 DIAGNOSIS — I4819 Other persistent atrial fibrillation: Secondary | ICD-10-CM | POA: Diagnosis present

## 2019-12-02 DIAGNOSIS — Z7901 Long term (current) use of anticoagulants: Secondary | ICD-10-CM | POA: Diagnosis not present

## 2019-12-02 DIAGNOSIS — Z9013 Acquired absence of bilateral breasts and nipples: Secondary | ICD-10-CM | POA: Diagnosis not present

## 2019-12-02 DIAGNOSIS — Z79811 Long term (current) use of aromatase inhibitors: Secondary | ICD-10-CM | POA: Diagnosis not present

## 2019-12-02 DIAGNOSIS — Z20822 Contact with and (suspected) exposure to covid-19: Secondary | ICD-10-CM | POA: Diagnosis present

## 2019-12-02 DIAGNOSIS — Z79899 Other long term (current) drug therapy: Secondary | ICD-10-CM | POA: Diagnosis not present

## 2019-12-02 DIAGNOSIS — J189 Pneumonia, unspecified organism: Secondary | ICD-10-CM | POA: Diagnosis present

## 2019-12-02 DIAGNOSIS — T447X5A Adverse effect of beta-adrenoreceptor antagonists, initial encounter: Secondary | ICD-10-CM | POA: Diagnosis present

## 2019-12-02 DIAGNOSIS — F0390 Unspecified dementia without behavioral disturbance: Secondary | ICD-10-CM | POA: Diagnosis present

## 2019-12-02 DIAGNOSIS — Z882 Allergy status to sulfonamides status: Secondary | ICD-10-CM | POA: Diagnosis not present

## 2019-12-02 DIAGNOSIS — R410 Disorientation, unspecified: Secondary | ICD-10-CM | POA: Diagnosis not present

## 2019-12-02 LAB — CBC
HCT: 40.6 % (ref 36.0–46.0)
Hemoglobin: 13.7 g/dL (ref 12.0–15.0)
MCH: 30.2 pg (ref 26.0–34.0)
MCHC: 33.7 g/dL (ref 30.0–36.0)
MCV: 89.6 fL (ref 80.0–100.0)
Platelets: 172 10*3/uL (ref 150–400)
RBC: 4.53 MIL/uL (ref 3.87–5.11)
RDW: 14.2 % (ref 11.5–15.5)
WBC: 5.6 10*3/uL (ref 4.0–10.5)
nRBC: 0 % (ref 0.0–0.2)

## 2019-12-02 LAB — BASIC METABOLIC PANEL
Anion gap: 11 (ref 5–15)
BUN: 32 mg/dL — ABNORMAL HIGH (ref 8–23)
CO2: 24 mmol/L (ref 22–32)
Calcium: 8.6 mg/dL — ABNORMAL LOW (ref 8.9–10.3)
Chloride: 100 mmol/L (ref 98–111)
Creatinine, Ser: 0.83 mg/dL (ref 0.44–1.00)
GFR calc Af Amer: >60 mL/min (ref >60)
GFR calc non Af Amer: >60 mL/min (ref >60)
Glucose, Bld: 106 mg/dL — ABNORMAL HIGH (ref 70–99)
Potassium: 3.2 mmol/L — ABNORMAL LOW (ref 3.5–5.1)
Sodium: 135 mmol/L (ref 135–145)

## 2019-12-02 LAB — TSH: TSH: 1.887 u[IU]/mL (ref 0.350–4.500)

## 2019-12-02 MED ORDER — DOCUSATE SODIUM 100 MG PO CAPS: 100.0000 mg | ORAL_CAPSULE | Freq: Two times a day (BID) | ORAL | Status: DC | PRN

## 2019-12-02 MED ORDER — POTASSIUM CHLORIDE CRYS ER 20 MEQ PO TBCR
40.0000 meq | EXTENDED_RELEASE_TABLET | ORAL | Status: AC
  Administered 2019-12-02: 40 meq via ORAL
  Filled 2019-12-02: qty 2

## 2019-12-02 MED ORDER — DILTIAZEM HCL-DEXTROSE 125-5 MG/125ML-% IV SOLN (PREMIX)
5.0000 mg/h | INTRAVENOUS | Status: DC
  Administered 2019-12-02 – 2019-12-03 (×2): 12.5 mg/h via INTRAVENOUS
  Filled 2019-12-02: qty 125

## 2019-12-02 MED ORDER — DOXYCYCLINE HYCLATE 100 MG PO TABS
100.0000 mg | ORAL_TABLET | Freq: Two times a day (BID) | ORAL | Status: DC
  Administered 2019-12-02 – 2019-12-03 (×3): 100 mg via ORAL
  Filled 2019-12-02 (×3): qty 1

## 2019-12-02 MED ORDER — DILTIAZEM HCL-DEXTROSE 125-5 MG/125ML-% IV SOLN (PREMIX)
5.0000 mg/h | INTRAVENOUS | Status: DC
  Administered 2019-12-02: 5 mg/h via INTRAVENOUS
  Administered 2019-12-02: 10 mg/h via INTRAVENOUS
  Filled 2019-12-02 (×2): qty 125

## 2019-12-02 MED ORDER — ACETAMINOPHEN 325 MG PO TABS: 650.0000 mg | ORAL_TABLET | Freq: Four times a day (QID) | ORAL | Status: DC | PRN

## 2019-12-02 MED ORDER — ONDANSETRON HCL 4 MG/2ML IJ SOLN: 4.0000 mg | Freq: Four times a day (QID) | INTRAMUSCULAR | Status: DC | PRN

## 2019-12-02 MED ORDER — DILTIAZEM LOAD VIA INFUSION
10.0000 mg | Freq: Once | INTRAVENOUS | Status: AC
  Administered 2019-12-02: 10 mg via INTRAVENOUS
  Filled 2019-12-02: qty 10

## 2019-12-02 NOTE — Progress Notes (Signed)
*  PRELIMINARY RESULTS* Echocardiogram 2D Echocardiogram has been performed.  Sherrie Sport 12/02/2019, 2:50 PM

## 2019-12-02 NOTE — TOC Initial Note (Signed)
Transition of Care [facility]) - Initial/Assessment Note    Patient Details  Name: Cassie TROPEANO, PhD MRN: [id number] Date of Birth: 04-01-1941  Transition of Care [facility]) CM/SW Contact:    Ova Freshwater Phone Number: [phone] 12/02/2019, 11:03 AM  Clinical Narrative:                    This CSW received a call from [facility] [phone], and stated the patient currently lives in their independent living unit.  Joelene Millin also stated the patient does not have immediate family contacts.     Patient Goals and CMS Choice        Expected Discharge Plan and Services                                                Prior Living Arrangements/Services                       Activities of Daily Living      Permission Sought/Granted                  Emotional Assessment              Admission diagnosis:  AMS (altered mental status) [R41.82] Atrial fibrillation with RVR ([place]) [I48.91] Patient Active Problem List   Diagnosis Date Noted  . AMS (altered mental status) 12/01/2019  . Atrial fibrillation with RVR ([place]) 12/01/2019  . Essential hypertension 12/01/2019  . History of breast cancer 12/01/2019  . S/P bilateral mastectomy 12/01/2019  . Breast cancer of upper-outer quadrant of left female breast ([place]) 08/18/2015  . 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 [place] [address] Phone: [phone] Fax: [place] [address], [place] - [place] [address] Phone: [phone] Fax: [phone]  [place], [place] [address] Phone: [phone] Fax: [phone]     Social Determinants of Health (SDOH) Interventions    Readmission Risk Interventions No  flowsheet data found.

## 2019-12-02 NOTE — Progress Notes (Signed)
[facility] Progress Note  Patient: Cassie Starch, PhD    [id number]  DOA: 12/01/2019     Date of Service: the patient was seen and examined on 12/02/2019  Chief Complaint  Patient presents with  . Altered Mental Status   Brief hospital course: Past medical history of HTN, breast cancer SP bilateral mastectomy in 2017, A. fib on Eliquis.  Patient was brought in with a complaint of altered mental status.  Currently further plan is continue rate control and monitor on telemetry.  Assessment and Plan: 1.  Acute metabolic encephalopathy Unresponsive event Suspect syncope Patient was found down in his room, confused. At the time of my evaluation patient does not have any focal deficit. Appears to be back to baseline and does not have any recollection of the event yesterday. Recently in the ER and was started on Lopressor. Suspect this is syncopal event in the setting of new beta-blocker medication as well as atrial fibrillation with RVR. At present CT scan of the head unremarkable as well. No further work-up indicated for now.  2.  Persistent A. fib with RVR Patient was recently started on Lopressor on 11/30/2019. Currently on Cardizem drip. Given that her heart rate is not well controlled drip increased from 6.5 to 10 mg. We will check echocardiogram. Patient has follow-up scheduled with Dr. Mabeline Caras outpatient. If the heart rate is difficult to control or if the echocardiogram shows further abnormality we will consult cardiology. Patient likely will benefit from 30-day event monitor outpatient. Continue Eliquis per pharmacy  3.  History of breast cancer Continue letrozole  4.  Generalized weakness PT consult  5.  Tachypnea. Left lower lobe community-acquired pneumonia on chest x-ray Chronic interstitial lung disease seen on the x-ray Patient does not have any symptoms of cough shortness of breath or fever. Given her unknown presentation and tachypnea, initiate oral  doxycycline. Repeat chest x-ray in 4 to 6 weeks to ensure resolution in patient with history of breast cancer  6.  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 Outpatient follow-up with PCP recommended at present.  Do not think that this is contributing to patient's current presentation.  Diet: Cardiac diet DVT Prophylaxis: Therapeutic Anticoagulation with Eliquis   Advance goals of care discussion: Full code  Family Communication: no family was present at bedside, at the time of interview.   Disposition:  Dispo: The patient is from: ALF              Anticipated d/c is to: ALF              Anticipated d/c date is: 1 day              Patient currently is not medically stable to d/c.  Subjective: No nausea no vomiting no fever no chills no chest pain no abdominal pain.  Does not remember the events of last night that brought to the hospital.  Wants to go home  Physical Exam: General:  alert oriented to time, place, and person.  Appear in mild distress, affect appropriate Eyes: PERRL ENT: Oral Mucosa Clear, moist  Neck: no JVD,  Cardiovascular: S1 and S2 Present, no Murmur,  Respiratory: good respiratory effort, Bilateral Air entry equal and Decreased, no Crackles, no wheezes Abdomen: Bowel Sound present, Soft and no tenderness,  Skin: no rash Extremities: no Pedal edema, no calf tenderness Neurologic: mental status, alert and oriented x3, speech normal, PERLA, Motor strength 5/5 and symmetric, Sensation grossly normal to light touch and Resting tremor noted  Bilateral upper extremity Gait not checked due to patient safety concerns  Vitals:   12/02/19 0630 12/02/19 0700 12/02/19 0800 12/02/19 0901  BP: 121/76 129/75 128/75 136/71  Pulse: 93 97 (!) 107   Resp: (!) 24 (!) 23 (!) 24 20  Temp:      TempSrc:      SpO2: 98% 100% 100% 100%  Weight:      Height:        Intake/Output Summary (Last 24 hours) at 12/02/2019 [id number] Last data filed at 12/02/2019 0859 Gross per 24 hour    Intake 60.77 ml  Output --  Net 60.77 ml   Filed Weights   12/01/19 1554  Weight: 55.9 kg    Data Reviewed: I have personally reviewed and interpreted daily labs, tele strips, imagings as discussed above. I reviewed all nursing notes, pharmacy notes, vitals, pertinent old records I have discussed plan of care as described above with RN and patient/family.  CBC: Recent Labs  Lab 11/30/19 1050 12/01/19 1925 12/02/19 0416  WBC 5.7 5.7 5.6  NEUTROABS  --  5.0  --   HGB 14.5 14.5 13.7  HCT 42.6 42.3 40.6  MCV 89.3 88.3 89.6  PLT 170 175 [id number]   Basic Metabolic Panel: Recent Labs  Lab 11/30/19 1050 12/01/19 1925 12/02/19 0416  NA 134* 134* 135  K 3.1* 3.7 3.2*  CL 99 99 100  CO2 22 22 24   GLUCOSE 120* 109* 106*  BUN 18 33* 32*  CREATININE 0.87 0.92 0.83  CALCIUM 8.8* 8.9 8.6*    Studies: CT Head Wo Contrast  Result Date: 12/01/2019 CLINICAL DATA:  Altered mental status EXAM: CT HEAD WITHOUT CONTRAST TECHNIQUE: Contiguous axial images were obtained from the base of the skull through the vertex without intravenous contrast. COMPARISON:  None. FINDINGS: Brain: There is no acute intracranial hemorrhage, mass effect, or edema. Gray-white differentiation is preserved. There is no extra-axial fluid collection. Prominence of the ventricles and sulci reflects minor generalized parenchymal volume loss. Patchy and confluent areas of hypoattenuation in the supratentorial white matter nonspecific but probably reflect moderate chronic microvascular ischemic changes. Vascular: No hyperdense vessel or unexpected calcification. Skull: Nonspecific focus of sclerosis within the right parietal calvarium. Sinuses/Orbits: Minimal retained secretions in the right sphenoid sinus. Orbits are unremarkable. Other: None. IMPRESSION: No acute intracranial hemorrhage, mass effect, or evidence of acute infarction. Chronic microvascular ischemic changes. Nonspecific sclerosis within the right parietal  calvarium. A sclerotic metastasis is not excluded in this patient with history of breast cancer. Electronically Signed   By: Macy Mis M.D.   On: 12/01/2019 16:17    Scheduled Meds: . apixaban  2.5 mg Oral BID  . letrozole  2.5 mg Oral Daily  . metoprolol tartrate  25 mg Oral BID   Continuous Infusions: . diltiazem (CARDIZEM) infusion 10 mg/hr (12/02/19 0859)   PRN Meds:   Time spent: 35 minutes  Author: Berle Mull, MD [facility] 12/02/2019 9:24 AM  To reach On-call, see care teams to locate the attending and reach out to them via [email or web address]. If 7PM-7AM, please contact night-coverage If you still have difficulty reaching the attending provider, please page the [facility] (Director on Call) for [facility] on amion for assistance.

## 2019-12-02 NOTE — ED Notes (Signed)
Awaiting med from pharmacy

## 2019-12-02 NOTE — Consult Note (Addendum)
ANTICOAGULATION CONSULT NOTE - Initial Consult  Pharmacy Consult for Apixiban Indication: atrial fibrillation  Allergies  Allergen Reactions  . Bactericin [Bacitracin] Rash    bactrim    Patient Measurements: Height: 5\' 5"  (165.1 cm) Weight: 55.9 kg (123 lb 3.8 oz) IBW/kg (Calculated) : 57   Vital Signs: BP: 136/71 (05/20 0901) Pulse Rate: 107 (05/20 0800)  Labs: Recent Labs    11/30/19 1050 11/30/19 1050 12/01/19 1925 12/01/19 2104 12/02/19 0416  HGB 14.5   < > 14.5  --  13.7  HCT 42.6  --  42.3  --  40.6  PLT 170  --  175  --  172  CREATININE 0.87  --  0.92  --  0.83  TROPONINIHS  --   --  50* 44*  --    < > = values in this interval not displayed.    Estimated Creatinine Clearance: 46.9 mL/min (by C-G formula based on SCr of 0.83 mg/dL).   Medical History: Past Medical History:  Diagnosis Date  . Atrial fibrillation ([place])   . Breast cancer ([place]) 2017   bilateral breast cancer  . Hypertension    OFF MEDS X 6 MONTHS DUE TO CONTROLLED BP (08-15-15)  . Osteoporosis     Medications:  Pta Eliquis dose 5mg  bid  Assessment: Eliquis review for a fib per pharmacy consult.   Goal of Therapy:  Monitor platelets by anticoagulation protocol: Yes   Plan:  Continue Apixiban 2.5mg  q12h (reduced dose per age > 75 and weight < 60kg)   Will assess CBC/Scr a minimum of every 3 days per protocol on Eliquis.  Lu Duffel, PharmD, BCPS Clinical Pharmacist 12/02/2019 9:44 AM

## 2019-12-03 ENCOUNTER — Encounter
Admission: RE | Admit: 2019-12-03 | Discharge: 2019-12-03 | Disposition: A | Discharge status: Home / Self Care | Payer: Medicare Other | Source: Ambulatory Visit | Attending: Internal Medicine | Admitting: Internal Medicine

## 2019-12-03 LAB — BASIC METABOLIC PANEL
Anion gap: 10 (ref 5–15)
BUN: 39 mg/dL — ABNORMAL HIGH (ref 8–23)
CO2: 24 mmol/L (ref 22–32)
Calcium: 8.6 mg/dL — ABNORMAL LOW (ref 8.9–10.3)
Chloride: 101 mmol/L (ref 98–111)
Creatinine, Ser: 0.87 mg/dL (ref 0.44–1.00)
GFR calc Af Amer: >60 mL/min (ref >60)
GFR calc non Af Amer: >60 mL/min (ref >60)
Glucose, Bld: 104 mg/dL — ABNORMAL HIGH (ref 70–99)
Potassium: 4 mmol/L (ref 3.5–5.1)
Sodium: 135 mmol/L (ref 135–145)

## 2019-12-03 LAB — CBC
HCT: 42.3 % (ref 36.0–46.0)
Hemoglobin: 14.4 g/dL (ref 12.0–15.0)
MCH: 30.3 pg (ref 26.0–34.0)
MCHC: 34 g/dL (ref 30.0–36.0)
MCV: 89.1 fL (ref 80.0–100.0)
Platelets: 171 10*3/uL (ref 150–400)
RBC: 4.75 MIL/uL (ref 3.87–5.11)
RDW: 14.3 % (ref 11.5–15.5)
WBC: 5.6 10*3/uL (ref 4.0–10.5)
nRBC: 0 % (ref 0.0–0.2)

## 2019-12-03 LAB — ECHOCARDIOGRAM COMPLETE
Height: 65 in
Weight: 1971.79 oz

## 2019-12-03 LAB — MAGNESIUM: Magnesium: 2 mg/dL (ref 1.7–2.4)

## 2019-12-03 MED ORDER — DOXYCYCLINE HYCLATE 100 MG PO TABS: 100.0000 mg | ORAL_TABLET | Freq: Two times a day (BID) | ORAL | 0 refills | Status: AC

## 2019-12-03 MED ORDER — DILTIAZEM HCL ER COATED BEADS 240 MG PO CP24: 240.0000 mg | ORAL_CAPSULE | Freq: Every day | ORAL | 0 refills | Status: DC

## 2019-12-03 MED ORDER — DILTIAZEM HCL ER COATED BEADS 120 MG PO CP24
240.0000 mg | ORAL_CAPSULE | Freq: Every day | ORAL | Status: DC
  Administered 2019-12-03: 240 mg via ORAL
  Filled 2019-12-03: qty 2

## 2019-12-03 NOTE — Progress Notes (Signed)
EMS transfer called, gave report to [place] at [facility].

## 2019-12-03 NOTE — Evaluation (Signed)
Physical Therapy Evaluation Patient Details Name: Cassie Lopez MRN: [id number] DOB: [birth year] Today's Date: 12/03/2019   History of Present Illness  Cassie Lopez is an 89yoF who comes to [facility] on 5/19 c AMS. Noted to have LLL PNA. PMH: HTN, BrCA s/p bilat mastectomy, AF on eliquis. PTA pt lives at [facility]. Pt reports no prior use of DME, no prior falls history, denies any limitations to mobility at baseline.  Clinical Impression  Pt admitted with above diagnosis. Pt currently with functional limitations due to the deficits listed below (see "PT Problem List"). Upon entry, pt in bed, awake and agreeable to participate. The pt is alert and oriented to self, pleasant. Pt has fluctuating orientation to place in questions, going back and forth between hospital and her ILF. Pt also disoriented to time, initially says she has bilat mastectomy/BrCA "2 years ago" (EMR says 2017). Author asks "2 years ago- what year would that be? What year did you have that procedure?," pt first responding "2021," then becoming indignant that Pryor Curia was inquiring, and then refusing to provide an answer. Pt is subsequently tangential in conversation. 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 Pt performs bed mobility with independence, transfers with supervision due to weakness and unsteadiness. AMB is slow, unsteady with lateral drifting. Pt has no gross LOB, but is easily distracted during AMB, stopping frequently to look into rooms. Pt's gait performance is not consistent with a person who reports no prior balance issues, no prior assistive device use, no prior halls history. Functional mobility assessment demonstrates increased effort/time requirements, poor  tolerance, and need for physical assistance, whereas the patient performed these at a higher level of independence PTA. At end of session pt is given a clock drawing test, pt asked to draw a clock that reads "10 past 11," instructions which she repeats to author before attempting. Test reveals significant cognitive impairment, warrants more detailed assessment at some venue, will defer to MD. See image below. Pt will benefit from skilled PT intervention to increase independence and safety with basic mobility in preparation for discharge to the venue listed below.     *clock drawing test: pt first writes "10", then writes "past," then write's "11," and finished with a squiggly forward slash between them.       Follow Up Recommendations Supervision/Assistance - 24 hour;Supervision for mobility/OOB    Equipment Recommendations  Rolling walker with 5" wheels    Recommendations for Other Services       Precautions / Restrictions Precautions Precautions: Fall Restrictions Weight Bearing Restrictions: No      Mobility  Bed Mobility Overal bed mobility: Needs Assistance Bed Mobility: Supine to Sit     Supine to sit: Supervision        Transfers Overall transfer level: Needs assistance Equipment used: None Transfers: Sit to/from Stand Sit to Stand: Min guard         General transfer comment: appears weak, immediately rises to counter support of BUE, tries to put on slippers on wrong feet.  Ambulation/Gait Ambulation/Gait assistance: Min guard Gait Distance (Feet): 180 Feet Assistive device: None Gait Pattern/deviations: Staggering left;Staggering right;Drifts right/left     General Gait Details: slow, unsteady, distracted, stops frequently to look  into people's rooms. Pt turns wrong direction once. No gross LOB, intermittent use of rail in hallway.  Stairs            Wheelchair Mobility    Modified Rankin (Stroke Patients Only)       Balance Overall balance  assessment: Needs assistance;History of Falls Sitting-balance support: No upper extremity supported;Feet supported Sitting balance-Leahy Scale: Good     Standing balance support: No upper extremity supported;During functional activity Standing balance-Leahy Scale: Poor                               Pertinent Vitals/Pain Pain Assessment: No/denies pain    Home Living Family/patient expects to be discharged to:: Private residence(Independent Living) Living Arrangements: Alone Available Help at Discharge: Friend(s) Type of Home: Apartment Home Access: Elevator     Home Layout: One level Home Equipment: None      Prior Function Level of Independence: Independent               Hand Dominance        Extremity/Trunk Assessment   Upper Extremity Assessment Upper Extremity Assessment: Overall WFL for tasks assessed;Generalized weakness    Lower Extremity Assessment Lower Extremity Assessment: Overall WFL for tasks assessed;Generalized weakness       Communication      Cognition Arousal/Alertness: Awake/alert Behavior During Therapy: Restless Overall Cognitive Status: Impaired/Different from baseline Area of Impairment: Orientation;Memory;Problem solving;Awareness;Safety/judgement;Following commands                 Orientation Level: Disoriented to;Place;Time;Situation(intermittently thinks to be at [place], then back at [facility])   Memory: Decreased recall of precautions Following Commands: Follows one step commands consistently;Follows multi-step commands inconsistently;Follows multi-step commands with increased time;Follows one step commands with increased time Safety/Judgement: Decreased awareness of safety;Decreased awareness of deficits Awareness: Anticipatory Problem Solving: Slow processing        General Comments      Exercises     Assessment/Plan    PT Assessment Patient needs continued PT services  PT Problem List  Decreased strength;Decreased cognition;Decreased activity tolerance;Decreased mobility;Decreased coordination       PT Treatment Interventions DME instruction;Gait training;Stair training;Functional mobility training;Therapeutic activities;Therapeutic exercise;Neuromuscular re-education;Patient/family education;Cognitive remediation;Balance training    PT Goals (Current goals can be found in the Care Plan section)  Acute Rehab PT Goals Patient Stated Goal: Pt wants to leave and return to home,. PT Goal Formulation: With patient Time For Goal Achievement: 12/17/19 Potential to Achieve Goals: Good    Frequency Min 2X/week(may need a higher frequency pending AMS workup)   Barriers to discharge        Co-evaluation               AM-PAC PT "6 Clicks" Mobility  Outcome Measure Help needed turning from your back to your side while in a flat bed without using bedrails?: None Help needed moving from lying on your back to sitting on the side of a flat bed without using bedrails?: None Help needed moving to and from a bed to a chair (including a wheelchair)?: A Little Help needed standing up from a chair using your arms (e.g., wheelchair or bedside chair)?: A Little Help needed to walk in hospital room?: A Little Help needed climbing 3-5 steps with a railing? : A Little 6 Click Score: 20    End of Session Equipment Utilized During Treatment: Gait belt Activity Tolerance: Patient limited by fatigue;No increased pain Patient  left: in bed;with call bell/phone within reach;with bed alarm set Nurse Communication: Mobility status(concerns regarding cognition) PT Visit Diagnosis: Unsteadiness on feet (R26.81);Difficulty in walking, not elsewhere classified (R26.2);Other symptoms and signs involving the nervous system (R29.898);History of falling (Z91.81);Other abnormalities of gait and mobility (R26.89)    Time: 1340-1405 PT Time Calculation (min) (ACUTE ONLY): 25 min   Charges:   PT  Evaluation $PT Eval Low Complexity: 1 Low          2:43 PM, 12/03/19 Etta Grandchild, PT, DPT Physical Therapist - [facility]  [phone] ([place])    [place] C 12/03/2019, 2:27 PM

## 2019-12-03 NOTE — TOC Initial Note (Signed)
Transition of Care [facility]) - Initial/Assessment Note    Patient Details  Name: Cassie WELCHEL, PhD MRN: [id number] Date of Birth: [birth year]  Transition [facility] Care [facility]) CM/SW Contact:    Victorino Dike, RN Phone Number: 12/03/2019, 2:10 PM  Clinical Narrative:                   Contacted by Dr. Posey Pronto [place] discharge planning.  Called Joelene Millin Page at [facility].  She would like patient to go to the Health Care center at [facility] for 5 days.  She asked me to discuss with patient.  Patient expresses agreement with going for 5 days.  Notified Dr Posey Pronto of information.  If patient discharges, she will be accepted at previously mentioned.  Discharge information needs to be received by 10am on Saturday  If patient to discharge over weekend and not today.  Expected Discharge Plan: Skilled Nursing Facility Barriers to Discharge: Continued Medical Work up   Patient Goals and CMS Choice Patient states their goals for this hospitalization and ongoing recovery are:: wants to go home. CMS Medicare.gov Compare Post Acute Care list provided to:: Patient Choice offered to / list presented to : Patient  Expected Discharge Plan and Services Expected Discharge Plan: [place]   Discharge Planning Services: CM Consult Post Acute Care Choice: [place] Living arrangements for the past 2 months: [place]                                      Prior Living Arrangements/Services Living arrangements for the past 2 months: [place] Lives with:: Self, Facility Resident                   Activities of Daily Living Home Assistive Devices/Equipment: Eyeglasses ADL Screening (condition at time of admission) Patient's cognitive ability adequate to safely complete daily activities?: Yes Is the patient deaf or have difficulty hearing?: No Does the patient have difficulty seeing, even when wearing glasses/contacts?:  No Does the patient have difficulty concentrating, remembering, or making decisions?: Yes Patient able to express need for assistance with ADLs?: Yes Does the patient have difficulty dressing or bathing?: No Independently performs ADLs?: Yes (appropriate for developmental age) Does the patient have difficulty walking or climbing stairs?: Yes Weakness of Legs: Both Weakness of Arms/Hands: Both  Permission Sought/Granted                  Emotional Assessment Appearance:: Appears stated age Attitude/Demeanor/Rapport: Engaged Affect (typically observed): Accepting Orientation: : Oriented to Self, Oriented to Situation, Oriented to Place Alcohol / Substance Use: Not Applicable Psych Involvement: No (comment)  Admission diagnosis:  Atrial fibrillation with rapid ventricular response (HCC) [I48.91] Atrial fibrillation with RVR ([facility]) [I48.91] Altered mental status, unspecified altered mental status type [R41.82] AMS (altered mental status) [R41.82] Patient Active Problem List   Diagnosis Date Noted  . AMS (altered mental status) 12/01/2019  . Atrial fibrillation with RVR ([place]) 12/01/2019  . Essential hypertension 12/01/2019  . History of breast cancer 12/01/2019  . S/P bilateral mastectomy 12/01/2019  . Breast cancer of upper-outer quadrant of left female breast ([place]) 08/18/2015  . 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

## 2019-12-03 NOTE — Plan of Care (Signed)
  Problem: Skin Integrity: Goal: Risk for impaired skin integrity will decrease 12/03/2019 0554 by Clelia Schaumann, RN Outcome: Progressing 12/03/2019 0553 by Clelia Schaumann, RN Outcome: Progressing   Problem: Safety: Goal: Ability to remain free from injury will improve 12/03/2019 0554 by Myelle Poteat, Nat Math, RN Outcome: Progressing 12/03/2019 0553 by Clelia Schaumann, RN Outcome: Progressing   Problem: Coping: Goal: Level of anxiety will decrease 12/03/2019 0554 by Clelia Schaumann, RN Outcome: Progressing 12/03/2019 0553 by Clelia Schaumann, RN Outcome: Progressing   Problem: Nutrition: Goal: Adequate nutrition will be maintained 12/03/2019 0554 by Clelia Schaumann, RN Outcome: Progressing 12/03/2019 0553 by Clelia Schaumann, RN Outcome: Progressing   Problem: Activity: Goal: Risk for activity intolerance will decrease 12/03/2019 0554 by Clelia Schaumann, RN Outcome: Progressing 12/03/2019 0553 by Clelia Schaumann, RN Outcome: Progressing   Problem: Clinical Measurements: Goal: Ability to maintain clinical measurements within normal limits will improve 12/03/2019 0554 by Jalacia Mattila, Nat Math, RN Outcome: Progressing 12/03/2019 0553 by Clelia Schaumann, RN Outcome: Progressing Goal: Will remain free from infection 12/03/2019 0554 by Clelia Schaumann, RN Outcome: Progressing 12/03/2019 0553 by Clelia Schaumann, RN Outcome: Progressing Goal: Diagnostic test results will improve 12/03/2019 0554 by Clelia Schaumann, RN Outcome: Progressing 12/03/2019 0553 by Clelia Schaumann, RN Outcome: Progressing Goal: Respiratory complications will improve 12/03/2019 0554 by Clelia Schaumann, RN Outcome: Progressing 12/03/2019 0553 by Clelia Schaumann, RN Outcome: Progressing Goal: Cardiovascular complication will be avoided 12/03/2019 0554 by Clelia Schaumann, RN Outcome: Progressing 12/03/2019 0553 by Clelia Schaumann, RN Outcome: Progressing   Problem: Health Behavior/Discharge Planning: Goal: Ability to manage health-related needs will  improve 12/03/2019 0554 by Taje Tondreau, Nat Math, RN Outcome: Progressing 12/03/2019 0553 by Clelia Schaumann, RN Outcome: Progressing   Problem: Education: Goal: Knowledge of General Education information will improve Description: Including pain rating scale, medication(s)/side effects and non-pharmacologic comfort measures 12/03/2019 0554 by Clelia Schaumann, RN Outcome: Progressing 12/03/2019 0553 by Clelia Schaumann, RN Outcome: Progressing

## 2019-12-03 NOTE — Discharge Summary (Signed)
[facility] Discharge Summary   Patient: Cassie Starch, PhD [id number]  PCP: Kirk Ruths, MD  Date of admission: 12/01/2019   Date of discharge:  12/03/2019     Discharge Diagnoses:  Principal Problem:   AMS (altered mental status) Active Problems:   Atrial fibrillation with RVR ([place])   Essential hypertension   History of breast cancer   S/P bilateral mastectomy early stage dementia/mild cognitive imbalance  Admitted From: ALF Disposition:  SNF   Recommendations for Outpatient Follow-up:  1. PCP: please follow up with PCP in 1 week 2. Follow up LABS/TEST:  Pt will need diagnostic test for dementia   Follow-up Information    Kirk Ruths, MD. Schedule an appointment as soon as possible for a visit in 1 week(s).   Specialty: Internal Medicine Contact information: [place] [facility] [place] [address] [phone]        Corey Skains, MD. Schedule an appointment as soon as possible for a visit in 1 week(s).   Specialty: Cardiology Why: may need a 30 day monitor.  Contact information: [address] [facility] [place] [address] [phone]          Diet recommendation: Cardiac diet  Activity: The patient is advised to gradually reintroduce usual activities, as tolerated  Discharge Condition: stable  Code Status: Full code   History of present illness: As per the H and P dictated on admission, "Cassie Starch, PhD is a 79 y.o. female with medical history significant for hypertension, history of breast cancer s/p bilateral mastectomy in 2017, recent diagnosis of atrial fibrillation on Eliquis who was sent by her facility with concerns of altered mental status.   Reportedly patient was found naked in her room today and appeared confused.  She was actually sent for similar symptoms yesterday to the ED although it was documented that she might have been sent by mistake.  She was  noted to be in slight atrial fibrillation with RVR and cardiology was consulted at that time and added metoprolol 25 mg twice daily to her regimen.  Patient does not recall starting this medication.  States that she normally manages her own medication the facility.  She is unable to recall events of her day to me although she "does not believe that she was found naked."  She was alert and oriented only to self.  She denies any chest pain, shortness of breath or palpitation.  No nausea, vomiting, constipation or diarrhea.  States she has good appetite at the facility."  Hospital Course:  Summary of her active problems in the hospital is as following. 1.  Acute metabolic encephalopathy Unresponsive event Suspect syncope Mild cognitive imbalance Patient was found down in her room, confused. At the time of my evaluation patient does not have any focal deficit. Appears to be back to baseline and does not have any recollection of the event. Recently in the ER and was started on Lopressor. Suspect this is syncopal event in the setting of new beta-blocker medication as well as atrial fibrillation with RVR. At present CT scan of the head unremarkable as well. Clock drawing test was done, pt able to draw the clock with numbers but unable to demonstrate actual clock hands for requested hour. Pt will benefit from further diagnostic work up and treatment of dementia   2.  Persistent A. fib with RVR Patient was recently started on Lopressor on 11/30/2019. Initially on Cardizem drip. Patient has  follow-up scheduled with Dr. Mabeline Caras outpatient. Patient likely will benefit from 30-day event monitor outpatient. Continue Eliquis per pharmacy Echocardiogram shows normal EF and no WMA  3.  History of breast cancer Continue letrozole  4.  Generalized weakness PT consult  5.  Tachypnea. Left lower lobe community-acquired pneumonia on chest x-ray Chronic interstitial lung disease seen on the  x-ray Patient does not have any symptoms of cough shortness of breath or fever. Given her unknown presentation and tachypnea, initiate oral doxycycline. Repeat chest x-ray in 4 to 6 weeks to ensure resolution in patient with history of breast cancer  6.  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 Outpatient follow-up with PCP recommended at present.  Do not think that this is contributing to patient's current presentation.  Patient was seen by physical therapy, who recommended SNF, which was arranged. On the day of the discharge the patient's vitals were stable, and no other acute medical condition were reported by patient. the patient was felt safe to be discharge at SNF with therapy.  Consultants: none Procedures: none  Discharge Exam: General: Appear in no distress, no Rash; Oral Mucosa Clear, moist. Cardiovascular: S1 and S2 Present, no Murmur, Respiratory: normal respiratory effort, Bilateral Air entry present and no Crackles, no wheezes Abdomen: Bowel Sound present, Soft and no tenderness, no hernia Extremities: no Pedal edema, no calf tenderness Neurology: alert and oriented to time, place, and person affect anxious, Occasional difficulty following complex commands.  Filed Weights   12/01/19 1554 12/02/19 2038 12/03/19 0359  Weight: 55.9 kg 57 kg 57.1 kg   Vitals:   12/03/19 0755 12/03/19 1219  BP: 128/72 114/77  Pulse: 98 66  Resp: 16 17  Temp: 98.8 F (37.1 C) 98.4 F (36.9 C)  SpO2: 97% 97%    DISCHARGE MEDICATION: Allergies as of 12/03/2019      Reactions   Bactericin [bacitracin] Rash   bactrim      Medication List    STOP taking these medications   diltiazem 30 MG tablet Commonly known as: Cardizem   metoprolol tartrate 25 MG tablet Commonly known as: LOPRESSOR     TAKE these medications   apixaban 5 MG Tabs tablet Commonly known as: ELIQUIS Take 1 tablet (5 mg total) by mouth 2 (two) times daily.   aspirin 81 MG tablet Take 81 mg by mouth  daily.   Calcium 600 600 MG Tabs tablet Generic drug: calcium carbonate Take 1,200 mg by mouth daily with breakfast.   diltiazem 240 MG 24 hr capsule Commonly known as: CARDIZEM CD Take 1 capsule (240 mg total) by mouth daily. Start taking on: Dec 04, 2019 What changed:   medication strength  how much to take   doxycycline 100 MG tablet Commonly known as: VIBRA-TABS Take 1 tablet (100 mg total) by mouth 2 (two) times daily for 3 days.   Fish Oil 1000 MG Caps Take 1,000 mg by mouth daily.   letrozole 2.5 MG tablet Commonly known as: FEMARA Take 1 tablet (2.5 mg total) by mouth daily.   Selenium 200 MCG Caps Take 200 mcg by mouth daily.   Super Quints B-50 Tabs Take by mouth.   Vitamin D3 25 MCG (1000 UT) Caps Take 2,000 Units by mouth daily.      Allergies  Allergen Reactions  . Bactericin [Bacitracin] Rash    bactrim   Discharge Instructions    Diet - low sodium heart healthy   Complete by: As directed    Increase activity slowly  Complete by: As directed       The results of significant diagnostics from this hospitalization (including imaging, microbiology, ancillary and laboratory) are listed below for reference.    Significant Diagnostic Studies: CT Head Wo Contrast  Result Date: 12/01/2019 CLINICAL DATA:  Altered mental status EXAM: CT HEAD WITHOUT CONTRAST TECHNIQUE: Contiguous axial images were obtained from the base of the skull through the vertex without intravenous contrast. COMPARISON:  None. FINDINGS: Brain: There is no acute intracranial hemorrhage, mass effect, or edema. Gray-white differentiation is preserved. There is no extra-axial fluid collection. Prominence of the ventricles and sulci reflects minor generalized parenchymal volume loss. Patchy and confluent areas of hypoattenuation in the supratentorial white matter nonspecific but probably reflect moderate chronic microvascular ischemic changes. Vascular: No hyperdense vessel or unexpected  calcification. Skull: Nonspecific focus of sclerosis within the right parietal calvarium. Sinuses/Orbits: Minimal retained secretions in the right sphenoid sinus. Orbits are unremarkable. Other: None. IMPRESSION: No acute intracranial hemorrhage, mass effect, or evidence of acute infarction. Chronic microvascular ischemic changes. Nonspecific sclerosis within the right parietal calvarium. A sclerotic metastasis is not excluded in this patient with history of breast cancer. Electronically Signed   By: Macy Mis M.D.   On: 12/01/2019 16:17   DG Chest Port 1 View  Result Date: 11/10/2019 CLINICAL DATA:  Weakness, shortness of breath EXAM: PORTABLE CHEST 1 VIEW COMPARISON:  None. FINDINGS: Heart is borderline in size. Airspace opacity noted in the left lower lobe concerning for pneumonia. Mild diffuse interstitial prominence throughout the lungs may reflect underlying chronic lung disease. No visible effusion or acute bony abnormality. IMPRESSION: Left lower lobe airspace opacity concerning for pneumonia. Diffuse interstitial prominence may reflect underlying chronic lung disease. Electronically Signed   By: Rolm Baptise M.D.   On: 11/10/2019 10:56   ECHOCARDIOGRAM COMPLETE  Result Date: 12/03/2019    ECHOCARDIOGRAM REPORT   Patient Name:   JESSLIN HERRAN Date of Exam: 12/02/2019 Medical Rec #:  [id number]     Height:       65.0 in Accession #:    [id number]    Weight:       123.2 lb Date of Birth:  [birth year]      BSA:          1.610 m Patient Age:    57 years      BP:           129/79 mmHg Patient Gender: F             HR:           86 bpm. Exam Location:  [facility] Procedure: 2D Echo, Cardiac Doppler and Color Doppler Indications:     Atrial Fibrillation 427.31  History:         Patient has no prior history of Echocardiogram examinations.                  Arrythmias:Atrial Fibrillation; Risk Factors:Hypertension.  Sonographer:     Sherrie Sport RDCS (AE) Referring Phys:  [id number] [place] Diagnosing Phys:  Ida Rogue MD IMPRESSIONS  1. Left ventricular ejection fraction, by estimation, is 50 to 55%. The left ventricle has low normal function. The left ventricle has no regional wall motion abnormalities. Left ventricular diastolic parameters are indeterminate.  2. Right ventricular systolic function is normal. The right ventricular size is normal. There is normal pulmonary artery systolic pressure.  3. Left atrial size was mildly dilated.  4. Mild to moderate mitral valve regurgitation.  5.  Rhythm is atrial fibrillation FINDINGS  Left Ventricle: Left ventricular ejection fraction, by estimation, is 50 to 55%. The left ventricle has low normal function. The left ventricle has no regional wall motion abnormalities. The left ventricular internal cavity size was normal in size. There is no left ventricular hypertrophy. Left ventricular diastolic parameters are indeterminate. Right Ventricle: The right ventricular size is normal. No increase in right ventricular wall thickness. Right ventricular systolic function is normal. There is normal pulmonary artery systolic pressure. The tricuspid regurgitant velocity is 2.04 m/s, and  with an assumed right atrial pressure of 10 mmHg, the estimated right ventricular systolic pressure is 123XX123 mmHg. Left Atrium: Left atrial size was mildly dilated. Right Atrium: Right atrial size was normal in size. Pericardium: A small pericardial effusion is present. Mitral Valve: The mitral valve is normal in structure. Normal mobility of the mitral valve leaflets. Mild to moderate mitral valve regurgitation. No evidence of mitral valve stenosis. Tricuspid Valve: The tricuspid valve is normal in structure. Tricuspid valve regurgitation is mild . No evidence of tricuspid stenosis. Aortic Valve: The aortic valve is normal in structure. Aortic valve regurgitation is trivial. No aortic stenosis is present. Aortic valve mean gradient measures 3.7 mmHg. Aortic valve peak gradient measures 6.2 mmHg.  Aortic valve area, by VTI measures 2.07 cm. Pulmonic Valve: The pulmonic valve was normal in structure. Pulmonic valve regurgitation is not visualized. No evidence of pulmonic stenosis. Aorta: The aortic root is normal in size and structure. Venous: The inferior vena cava is normal in size with greater than 50% respiratory variability, suggesting right atrial pressure of 3 mmHg. IAS/Shunts: No atrial level shunt detected by color flow Doppler.  LEFT VENTRICLE PLAX 2D LVIDd:         4.77 cm LVIDs:         3.54 cm LV PW:         0.92 cm LV IVS:        0.93 cm LVOT diam:     2.00 cm LV SV:         42 LV SV Index:   26 LVOT Area:     3.14 cm  RIGHT VENTRICLE RV Basal diam:  3.11 cm RV S prime:     9.25 cm/s TAPSE (M-mode): 2.6 cm LEFT ATRIUM             Index       RIGHT ATRIUM           Index LA diam:        4.30 cm 2.67 cm/m  RA Area:     16.40 cm LA Vol (A2C):   56.6 ml 35.15 ml/m RA Volume:   39.50 ml  24.53 ml/m LA Vol (A4C):   56.3 ml 34.96 ml/m LA Biplane Vol: 57.7 ml 35.83 ml/m  AORTIC VALVE                   PULMONIC VALVE AV Area (Vmax):    1.94 cm    PV Vmax:        0.69 m/s AV Area (Vmean):   1.82 cm    PV Peak grad:   1.9 mmHg AV Area (VTI):     2.07 cm    RVOT Peak grad: 3 mmHg AV Vmax:           124.33 cm/s AV Vmean:          88.167 cm/s AV VTI:  0.202 m AV Peak Grad:      6.2 mmHg AV Mean Grad:      3.7 mmHg LVOT Vmax:         76.90 cm/s LVOT Vmean:        51.200 cm/s LVOT VTI:          0.133 m LVOT/AV VTI ratio: 0.66  AORTA Ao Root diam: 3.20 cm MITRAL VALVE               TRICUSPID VALVE MV Area (PHT): 5.54 cm    TR Peak grad:   16.6 mmHg MV Decel Time: 137 msec    TR Vmax:        204.00 cm/s MV E velocity: 98.50 cm/s                            SHUNTS                            Systemic VTI:  0.13 m                            Systemic Diam: 2.00 cm Ida Rogue MD Electronically signed by Ida Rogue MD Signature Date/Time: 12/03/2019/1:08:15 PM    Final      Microbiology: Recent Results (from the past 240 hour(s))  SARS Coronavirus 2 by RT PCR (hospital order, performed in [place] hospital lab) Nasopharyngeal Nasopharyngeal Swab     Status: None   Collection Time: 12/01/19  9:04 PM   Specimen: Nasopharyngeal Swab  Result Value Ref Range Status   SARS Coronavirus 2 NEGATIVE NEGATIVE Final    Comment: (NOTE) SARS-CoV-2 target nucleic acids are NOT DETECTED. The SARS-CoV-2 RNA is generally detectable in upper and lower respiratory specimens during the acute phase of infection. The lowest concentration of SARS-CoV-2 viral copies this assay can detect is 250 copies / mL. A negative result does not preclude SARS-CoV-2 infection and should not be used as the sole basis for treatment or other patient management decisions.  A negative result may occur with improper specimen collection / handling, submission of specimen other than nasopharyngeal swab, presence of viral mutation(s) within the areas targeted by this assay, and inadequate number of viral copies (<250 copies / mL). A negative result must be combined with clinical observations, patient history, and epidemiological information. Fact Sheet for Patients:   StrictlyIdeas.no Fact Sheet for Healthcare Providers: BankingDealers.co.za This test is not yet approved or cleared  by the [place] FDA and has been authorized for detection and/or diagnosis of SARS-CoV-2 by FDA under an Emergency Use Authorization (EUA).  This EUA will remain in effect (meaning this test can be used) for the duration of the COVID-19 declaration under Section 564(b)(1) of the Act, 21 U.S.C. section 360bbb-3(b)(1), unless the authorization is terminated or revoked sooner. Performed at [facility], [address] [place],  [address]      Labs: CBC: Recent Labs  Lab 11/30/19 1050 12/01/19 1925 12/02/19 0416 12/03/19 0534  WBC 5.7  5.7 5.6 5.6  NEUTROABS  --  5.0  --   --   HGB 14.5 14.5 13.7 14.4  HCT 42.6 42.3 40.6 42.3  MCV 89.3 88.3 89.6 89.1  PLT 170 175 172 [id number]   Basic Metabolic Panel: Recent Labs  Lab 11/30/19 1050 12/01/19 1925 12/02/19 0416 12/03/19 0534  NA 134* 134*  135 135  K 3.1* 3.7 3.2* 4.0  CL 99 99 100 101  CO2 22 22 24 24   GLUCOSE 120* 109* 106* 104*  BUN 18 33* 32* 39*  CREATININE 0.87 0.92 0.83 0.87  CALCIUM 8.8* 8.9 8.6* 8.6*  MG  --   --   --  2.0   Liver Function Tests: No results for input(s): AST, ALT, ALKPHOS, BILITOT, PROT, ALBUMIN in the last 168 hours. No results for input(s): LIPASE, AMYLASE in the last 168 hours. No results for input(s): AMMONIA in the last 168 hours. Cardiac Enzymes: No results for input(s): CKTOTAL, CKMB, CKMBINDEX, TROPONINI in the last 168 hours. BNP (last 3 results) Recent Labs    11/10/19 1004  BNP 338.0*   CBG: No results for input(s): GLUCAP in the last 168 hours.  Time spent: 35 minutes  Signed:  Berle Mull  [facility]  12/03/2019 2:34 PM

## 2019-12-03 NOTE — Plan of Care (Signed)
VSS throughout shift, off Cardizem drip, HR stable. Ambulated to bathroom stand by assist. Pt repeatedly requesting to go home, reassuring patient and reorienting as needed.  Problem: Clinical Measurements: Goal: Ability to maintain clinical measurements within normal limits will improve Outcome: Progressing Goal: Cardiovascular complication will be avoided Outcome: Progressing   Problem: Activity: Goal: Risk for activity intolerance will decrease Outcome: Progressing   Problem: Coping: Goal: Level of anxiety will decrease Outcome: Not Progressing

## 2019-12-03 NOTE — NC FL2 (Signed)
  [place] LEVEL OF CARE SCREENING TOOL     IDENTIFICATION  Patient Name: Cassie Starch, PhD Birthdate: 1941/03/20 Sex: female Admission Date (Current Location): 12/01/2019  [facility] and [place] Number:  Engineering geologist and Address:  [facility], [address], [place], [place] [address]      Provider Number: [id number]  Attending Physician Name and Address:  Lavina Hamman, MD  Relative Name and Phone Number:       Current Level of Care: Hospital Recommended Level of Care: [place] Prior Approval Number:    Date Approved/Denied:   PASRR Number:    Discharge Plan: SNF    Current Diagnoses: Patient Active Problem List   Diagnosis Date Noted  . AMS (altered mental status) 12/01/2019  . Atrial fibrillation with RVR ([place]) 12/01/2019  . Essential hypertension 12/01/2019  . History of breast cancer 12/01/2019  . S/P bilateral mastectomy 12/01/2019  . Breast cancer of upper-outer quadrant of left female breast ([place]) 08/18/2015  . 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    Orientation RESPIRATION BLADDER Height & Weight     Self, Situation, Place  Normal Incontinent Weight: 57.1 kg Height:  5\' 5"  (165.1 cm)  BEHAVIORAL SYMPTOMS/MOOD NEUROLOGICAL BOWEL NUTRITION STATUS      Continent    AMBULATORY STATUS COMMUNICATION OF NEEDS Skin   Limited Assist Verbally Normal                       Personal Care Assistance Level of Assistance              Functional Limitations Info  Hearing   Hearing Info: Impaired      SPECIAL CARE FACTORS FREQUENCY  PT (By licensed PT), OT (By licensed OT)     PT Frequency: 7x a week OT Frequency: 7x a week            Contractures Contractures Info: Not present    Additional Factors Info  Allergies   Allergies Info: Bacitracin           Current Medications (12/03/2019):  This is the current hospital active medication list Current  Facility-Administered Medications  Medication Dose Route Frequency Provider Last Rate Last Admin  . acetaminophen (TYLENOL) tablet 650 mg  650 mg Oral Q6H PRN Lavina Hamman, MD      . apixaban Arne [place]) tablet 2.5 mg  2.5 mg Oral BID Tu, Ching T, DO   2.5 mg at 12/03/19 0811  . diltiazem (CARDIZEM CD) 24 hr capsule 240 mg  240 mg Oral Daily Lavina Hamman, MD   240 mg at 12/03/19 0811  . docusate sodium (COLACE) capsule 100 mg  100 mg Oral BID PRN Lavina Hamman, MD      . doxycycline (VIBRA-TABS) tablet 100 mg  100 mg Oral Q12H Oswald Hillock, RPH   100 mg at 12/03/19 [id number]  . letrozole [facility]) tablet 2.5 mg  2.5 mg Oral Daily Tu, Ching T, DO   2.5 mg at 12/03/19 0810  . ondansetron (ZOFRAN) injection 4 mg  4 mg Intravenous Q6H PRN Lavina Hamman, MD         Discharge Medications: Please see discharge summary for a list of discharge medications.  Relevant Imaging Results:  Relevant Lab Results:   Additional Information    Victorino Dike, RN

## 2019-12-06 DIAGNOSIS — F0390 Unspecified dementia without behavioral disturbance: Secondary | ICD-10-CM | POA: Diagnosis present

## 2019-12-23 ENCOUNTER — Other Ambulatory Visit
Admission: RE | Admit: 2019-12-23 | Discharge: 2019-12-23 | Disposition: A | Discharge status: Home / Self Care | Payer: Medicare Other | Source: Ambulatory Visit | Attending: Internal Medicine | Admitting: Internal Medicine

## 2019-12-23 DIAGNOSIS — R21 Rash and other nonspecific skin eruption: Secondary | ICD-10-CM | POA: Diagnosis present

## 2019-12-23 DIAGNOSIS — R410 Disorientation, unspecified: Secondary | ICD-10-CM | POA: Insufficient documentation

## 2019-12-24 LAB — RPR: RPR Ser Ql: NONREACTIVE

## 2020-01-28 ENCOUNTER — Other Ambulatory Visit: Payer: Self-pay

## 2020-01-28 ENCOUNTER — Encounter: Payer: Self-pay | Admitting: Internal Medicine

## 2020-01-28 ENCOUNTER — Inpatient Hospital Stay: Payer: Medicare Other | Attending: Internal Medicine | Admitting: Internal Medicine

## 2020-01-28 ENCOUNTER — Inpatient Hospital Stay: Payer: Medicare Other

## 2020-01-28 VITALS — BP 118/63 | HR 80 | Temp 98.4°F | Resp 18 | Ht 65.0 in | Wt 122.0 lb

## 2020-01-28 DIAGNOSIS — Z9013 Acquired absence of bilateral breasts and nipples: Secondary | ICD-10-CM | POA: Diagnosis not present

## 2020-01-28 DIAGNOSIS — Z170 Estrogen receptor positive status [ER+]: Secondary | ICD-10-CM

## 2020-01-28 DIAGNOSIS — C50412 Malignant neoplasm of upper-outer quadrant of left female breast: Secondary | ICD-10-CM

## 2020-01-28 DIAGNOSIS — Z79811 Long term (current) use of aromatase inhibitors: Secondary | ICD-10-CM | POA: Insufficient documentation

## 2020-01-28 DIAGNOSIS — Z7901 Long term (current) use of anticoagulants: Secondary | ICD-10-CM

## 2020-01-28 DIAGNOSIS — D0511 Intraductal carcinoma in situ of right breast: Secondary | ICD-10-CM | POA: Diagnosis not present

## 2020-01-28 DIAGNOSIS — C773 Secondary and unspecified malignant neoplasm of axilla and upper limb lymph nodes: Secondary | ICD-10-CM | POA: Diagnosis not present

## 2020-01-28 DIAGNOSIS — M899 Disorder of bone, unspecified: Secondary | ICD-10-CM | POA: Diagnosis not present

## 2020-01-28 DIAGNOSIS — I4891 Unspecified atrial fibrillation: Secondary | ICD-10-CM

## 2020-01-28 DIAGNOSIS — F0390 Unspecified dementia without behavioral disturbance: Secondary | ICD-10-CM | POA: Diagnosis not present

## 2020-01-28 LAB — COMPREHENSIVE METABOLIC PANEL
ALT: 21 U/L (ref 0–44)
AST: 30 U/L (ref 15–41)
Albumin: 3.3 g/dL — ABNORMAL LOW (ref 3.5–5.0)
Alkaline Phosphatase: 67 U/L (ref 38–126)
Anion gap: 9 (ref 5–15)
BUN: 18 mg/dL (ref 8–23)
CO2: 28 mmol/L (ref 22–32)
Calcium: 8.4 mg/dL — ABNORMAL LOW (ref 8.9–10.3)
Chloride: 104 mmol/L (ref 98–111)
Creatinine, Ser: 0.66 mg/dL (ref 0.44–1.00)
GFR calc Af Amer: >60 mL/min (ref >60)
GFR calc non Af Amer: >60 mL/min (ref >60)
Glucose, Bld: 104 mg/dL — ABNORMAL HIGH (ref 70–99)
Potassium: 3 mmol/L — ABNORMAL LOW (ref 3.5–5.1)
Sodium: 141 mmol/L (ref 135–145)
Total Bilirubin: 0.7 mg/dL (ref 0.3–1.2)
Total Protein: 6.5 g/dL (ref 6.5–8.1)

## 2020-01-28 LAB — CBC WITH DIFFERENTIAL/PLATELET
Abs Immature Granulocytes: 0.02 10*3/uL (ref 0.00–0.07)
Basophils Absolute: 0 10*3/uL (ref 0.0–0.1)
Basophils Relative: 1 %
Eosinophils Absolute: 0.8 10*3/uL — ABNORMAL HIGH (ref 0.0–0.5)
Eosinophils Relative: 17 %
HCT: 35.8 % — ABNORMAL LOW (ref 36.0–46.0)
Hemoglobin: 12 g/dL (ref 12.0–15.0)
Immature Granulocytes: 0 %
Lymphocytes Relative: 21 %
Lymphs Abs: 0.9 10*3/uL (ref 0.7–4.0)
MCH: 29.6 pg (ref 26.0–34.0)
MCHC: 33.5 g/dL (ref 30.0–36.0)
MCV: 88.2 fL (ref 80.0–100.0)
Monocytes Absolute: 0.4 10*3/uL (ref 0.1–1.0)
Monocytes Relative: 9 %
Neutro Abs: 2.3 10*3/uL (ref 1.7–7.7)
Neutrophils Relative %: 52 %
Platelets: 252 10*3/uL (ref 150–400)
RBC: 4.06 MIL/uL (ref 3.87–5.11)
RDW: 17.2 % — ABNORMAL HIGH (ref 11.5–15.5)
WBC: 4.5 10*3/uL (ref 4.0–10.5)
nRBC: 0 % (ref 0.0–0.2)

## 2020-01-28 NOTE — Progress Notes (Signed)
[facility] NOTE  Patient Care Team: Kirk Ruths, MD as PCP - General (Internal Medicine) Kirk Ruths, MD (Internal Medicine) Bary Castilla Forest Gleason, MD (General Surgery)  CHIEF COMPLAINTS/PURPOSE OF CONSULTATION: BREAST CANCER  #  Oncology History Overview Note  # RIGHT BREAST; SIMPLE MASTECTOMY:  - NEGATIVE FOR RESIDUAL DUCTAL CARCINOMA IN SITU.  - BIOPSY SITE CHANGES, MARKER MATERIAL PRESENT.  - SEE SUMMARY BELOW.   B. LEFT BREAST; SIMPLE MASTECTOMY:  - INVASIVE LOBULAR CARCINOMA, CLASSIC TYPE.  - THE DEEP MARGIN OF RESECTION IS INVOLVED.  - SEE SUMMARY BELOW.   C. RIGHT SENTINEL LYMPH NODE #1; EXCISION:  - NO TUMOR SEEN BY CYTOKERATIN STAIN IN ONE LYMPH NODE.   D. LEFT SENTINEL LYMPH NODE #1; EXCISION:  - MICROMETASTASIS IDENTIFIED BY CYTOKERATIN STAIN (1 MM) IN ONE LYMPH  NODE.   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  female history of left-sided breast cancer; right-sided DCIS status post bilateral mastectomy has been referred to [place] for further evaluation recommendations given recent abnormal findings noted on CT brain.  In 2017 patient diagnosed with left breast cancer stage T1N1 [micro met]-treated s/p mastectomy followed by antihormone therapy.  Did not receive chemotherapy.  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  Patient followed by Dr. Tollie Pizza.  Patient did not see medical oncology at the time.  No role for radiation.  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  No nausea no vomiting.  No headaches.   As per caregiver patient moved to memory care from independent living few months ago because of worsening memory issues.  Review of Systems  Constitutional: Negative for chills, diaphoresis, fever, malaise/fatigue and weight loss.  HENT: Negative for nosebleeds and sore throat.   Eyes: Negative for double vision.  Respiratory: Negative for cough, hemoptysis, sputum production, shortness of breath and wheezing.   Cardiovascular: Negative for chest pain, palpitations, orthopnea and leg swelling.  Gastrointestinal: Negative for abdominal pain, blood in stool, constipation, diarrhea, heartburn, melena, nausea and vomiting.  Genitourinary: Negative for dysuria, frequency and urgency.  Musculoskeletal: Positive for back pain and joint pain.  Skin: Negative.  Negative for itching and rash.  Neurological: Negative for dizziness, tingling, focal weakness, weakness and headaches.  Endo/Heme/Allergies: Does not bruise/bleed easily.  Psychiatric/Behavioral: Positive for memory loss. Negative for depression. The patient has insomnia. The patient is not nervous/anxious.      MEDICAL HISTORY:  Past Medical History:  Diagnosis Date   Atrial fibrillation ([place])    Breast cancer ([place]) 2017   bilateral breast cancer   Hypertension    OFF MEDS X 6 MONTHS DUE TO CONTROLLED BP (08-15-15)   Osteoporosis     SURGICAL HISTORY: Past Surgical History:  Procedure Laterality Date   ANKLE SURGERY Left 2011   BILATERAL TOTAL MASTECTOMY WITH AXILLARY LYMPH NODE DISSECTION Bilateral 08/24/2015   Left T2, N50m ER/PR +; Her 2 neu not overexpressed, Right: DCIS.  BREAST BIOPSY Right 08/08/15   stereo/DUCTAL CARCINOMA IN SITU    BREAST BIOPSY Left 08-15-15   INVASIVE CARCINOMA, dr byrnett   CATARACT EXTRACTION W/PHACO Left 04/10/2017   Procedure: CATARACT EXTRACTION PHACO AND INTRAOCULAR LENS PLACEMENT (I[facility]);  Surgeon:  Leandrew Koyanagi, MD;  Location: [facility];  Service: Ophthalmology;  Laterality: Left;  [place] 01:13.3 AP% 14.8 CDE 10.81 Fluid Pack  lot # [id number]   CATARACT EXTRACTION W/PHACO Right 05/14/2017   Procedure: CATARACT EXTRACTION PHACO AND INTRAOCULAR LENS PLACEMENT ([place])  RIGHT;  Surgeon: Leandrew Koyanagi, MD;  Location: [facility];  Service: Ophthalmology;  Laterality: Right;   FRACTURE SURGERY     left breast excision Left    MASTECTOMY Bilateral 08/24/2015   SENTINEL NODE BIOPSY Bilateral 08/24/2015   Procedure: SENTINEL NODE BIOPSY;  Surgeon: Robert Bellow, MD;  Location: [facility];  Service: General;  Laterality: Bilateral;   WRIST FRACTURE SURGERY      SOCIAL HISTORY: Social History   Socioeconomic History   Marital status: Widowed    Spouse name: Not on file   Number of children: Not on file   Years of education: Not on file   Highest education level: Not on file  Occupational History   Not on file  Tobacco Use   Smoking status: Never Smoker   Smokeless tobacco: Never Used  Vaping Use   Vaping Use: Never used  Substance and Sexual Activity   Alcohol use: No    Alcohol/week: 0.0 standard drinks   Drug use: No   Sexual activity: Not on file  Other Topics Concern   Not on file  Social History Narrative   [place]/Memory care [used to live in independent living in April 2021]; never smoked; no alcohol;    Social Determinants of Radio broadcast assistant Strain:    Difficulty of Paying Living Expenses:   Food Insecurity:    Worried About Charity fundraiser in the Last Year:    Arboriculturist in the Last Year:   Transportation Needs:    Film/video editor (Medical):    Lack of Transportation (Non-Medical):   Physical Activity:    Days of Exercise per Week:    Minutes of Exercise per Session:   Stress:    Feeling of Stress :   Social Connections:    Frequency of Communication with Friends and Family:     Frequency of Social Gatherings with Friends and Family:    Attends Religious Services:    Active Member of Clubs or Organizations:    Attends Music therapist:    Marital Status:   Intimate Partner Violence:    Fear of Current or Ex-Partner:    Emotionally Abused:    Physically Abused:    Sexually Abused:     FAMILY HISTORY: Family History  Problem Relation Age of Onset   Cancer Neg Hx     ALLERGIES:  is allergic to bactericin [bacitracin].  MEDICATIONS:  Current Outpatient Medications  Medication Sig Dispense Refill   apixaban (ELIQUIS) 5 MG TABS tablet Take 1 tablet (5 mg total) by mouth 2 (two) times daily. 120 tablet 1   aspirin 81 MG tablet Take 81 mg by mouth daily.     B Complex-Biotin-FA (SUPER QUINTS B-50) TABS Take by mouth.     calcium carbonate (CALCIUM 600) 600 MG TABS tablet Take 1,200 mg by mouth daily with breakfast.      Cholecalciferol (VITAMIN D3) 1000 units CAPS Take  2,000 Units by mouth daily.      diltiazem (CARDIZEM CD) 240 MG 24 hr capsule Take 1 capsule (240 mg total) by mouth daily. 30 capsule 0   letrozole (FEMARA) 2.5 MG tablet Take 1 tablet (2.5 mg total) by mouth daily. 90 tablet 3   Omega-3 Fatty Acids (FISH OIL) 1000 MG CAPS Take 1,000 mg by mouth daily.      Selenium 200 MCG CAPS Take 200 mcg by mouth daily.      No current facility-administered medications for this visit.      Marland Kitchen  PHYSICAL EXAMINATION: ECOG PERFORMANCE STATUS: 0 - Asymptomatic  Vitals:   01/28/20 1145  BP: 118/63  Pulse: 80  Resp: 18  Temp: 98.4 F (36.9 C)  SpO2: 100%   Filed Weights   01/28/20 1145  Weight: 122 lb (55.3 kg)    Physical Exam Constitutional:      Comments: Frail-appearing Caucasian female patient.  She is walking independently.  Accompanied by caregiver.  HENT:     Head: Normocephalic and atraumatic.     Mouth/Throat:     Pharynx: No oropharyngeal exudate.  Eyes:     Pupils: Pupils are equal, round, and  reactive to light.  Cardiovascular:     Rate and Rhythm: Normal rate and regular rhythm.  Pulmonary:     Effort: Pulmonary effort is normal. No respiratory distress.     Breath sounds: Normal breath sounds. No wheezing.  Abdominal:     General: Bowel sounds are normal. There is no distension.     Palpations: Abdomen is soft. There is no mass.     Tenderness: There is no abdominal tenderness. There is no guarding or rebound.  Musculoskeletal:        General: No tenderness. Normal range of motion.     Cervical back: Normal range of motion and neck supple.  Skin:    General: Skin is warm.  Neurological:     Mental Status: She is alert and oriented to person, place, and time.  Psychiatric:        Mood and Affect: Affect normal.      LABORATORY DATA:  I have reviewed the data as listed Lab Results  Component Value Date   WBC 4.5 01/28/2020   HGB 12.0 01/28/2020   HCT 35.8 (L) 01/28/2020   MCV 88.2 01/28/2020   PLT 252 01/28/2020   Recent Labs    11/10/19 1004 11/30/19 1050 12/02/19 0416 12/03/19 0534 01/28/20 1220  NA 141   < > 135 135 141  K 3.5   < > 3.2* 4.0 3.0*  CL 107   < > 100 101 104  CO2 23   < > _0 GLUCOSE 118*   < > 106* 104* 104*  BUN 20   < > 32* 39* 18  CREATININE 0.98   < > 0.83 0.87 0.66  CALCIUM 9.6   < > 8.6* 8.6* 8.4*  GFRNONAA 54*   < > >60 >60 >60  GFRAA >60   < > >60 >60 >60  PROT 7.8  --   --   --  6.5  ALBUMIN 4.7  --   --   --  3.3*  AST 53*  --   --   --  30  ALT 61*  --   --   --  21  ALKPHOS 55  --   --   --  67  BILITOT 1.2  --   --   --  0.7   < > = values in this interval not displayed.    RADIOGRAPHIC STUDIES: I have personally reviewed the radiological images as listed and agreed with the findings in the report. No results found.  ASSESSMENT & PLAN:   Carcinoma of upper-outer quadrant of left breast in female, estrogen receptor positive ([place]) # Left breast cancer-T2N1-ER Pos; right breast DCIS; s/p bilateral  mastectomies-currently on letrozole.  Question concern for recurrence- [incidental CT head]-sclerotic right parietal calvarium.   #Given the concerns for bony metastases [incidental]-I would recommend further evaluation with a PET scan.  Check CBC CMP tumor markers.  Continue letrozole for now.  # A.fib- on eliquis-no evidence of bleeding.  #Dementia-worsening question etiology.  As per PCP.  Thank you, Dr. Ruthann Cancer for allowing me to participate in the care of your pleasant patient. Please do not hesitate to contact me with questions or concerns in the interim.  # DISPOSITION:  # labs today # PET scan # Follow up in 2 weeks- MD; No labs- PET prior- Dr.B  # I reviewed the blood work- with the patient in detail; also reviewed the imaging independently [as summarized above]; and with the patient in detail.   Reviewed the records from recent [place] hospital stay/discharge summary reviewed- summarized above.     All questions were answered. The patient knows to call the clinic with any problems, questions or concerns.    Cammie Sickle, MD 01/31/2020 9:21 AM

## 2020-01-28 NOTE — Assessment & Plan Note (Addendum)
#   Left breast cancer-T2N1-ER Pos; right breast DCIS; s/p bilateral mastectomies-currently on letrozole.  Question concern for recurrence- [incidental CT head]-sclerotic right parietal calvarium.   #Given the concerns for bony metastases [incidental]-I would recommend further evaluation with a PET scan.  Check CBC CMP tumor markers.  Continue letrozole for now.  # A.fib- on eliquis-no evidence of bleeding.  #Dementia-worsening question etiology.  As per PCP.  Thank you, Dr. Ruthann Cancer for allowing me to participate in the care of your pleasant patient. Please do not hesitate to contact me with questions or concerns in the interim.  # DISPOSITION:  # labs today # PET scan # Follow up in 2 weeks- MD; No labs- PET prior- Dr.B  # I reviewed the blood work- with the patient in detail; also reviewed the imaging independently [as summarized above]; and with the patient in detail.   Reviewed the records from recent [place] hospital stay/discharge summary reviewed- summarized above.

## 2020-01-29 LAB — CANCER ANTIGEN 15-3: CA 15-3: 18 U/mL (ref 0.0–25.0)

## 2020-01-29 LAB — CANCER ANTIGEN 27.29: CA 27.29: 18.6 U/mL (ref 0.0–38.6)

## 2020-01-29 LAB — CEA: CEA: 2.4 ng/mL (ref 0.0–4.7)

## 2020-01-31 ENCOUNTER — Encounter
Admission: RE | Admit: 2020-01-31 | Discharge: 2020-01-31 | Disposition: A | Discharge status: Home / Self Care | Payer: Medicare Other | Source: Ambulatory Visit | Attending: Internal Medicine | Admitting: Internal Medicine

## 2020-02-09 ENCOUNTER — Ambulatory Visit
Admission: RE | Admit: 2020-02-09 | Discharge: 2020-02-09 | Disposition: A | Discharge status: Home / Self Care | Payer: Medicare Other | Source: Ambulatory Visit | Attending: Internal Medicine | Admitting: Internal Medicine

## 2020-02-09 ENCOUNTER — Other Ambulatory Visit: Payer: Self-pay

## 2020-02-09 DIAGNOSIS — C50412 Malignant neoplasm of upper-outer quadrant of left female breast: Secondary | ICD-10-CM | POA: Diagnosis not present

## 2020-02-09 DIAGNOSIS — E041 Nontoxic single thyroid nodule: Secondary | ICD-10-CM | POA: Diagnosis not present

## 2020-02-09 DIAGNOSIS — J90 Pleural effusion, not elsewhere classified: Secondary | ICD-10-CM | POA: Diagnosis not present

## 2020-02-09 DIAGNOSIS — Z170 Estrogen receptor positive status [ER+]: Secondary | ICD-10-CM | POA: Insufficient documentation

## 2020-02-09 DIAGNOSIS — I700 Atherosclerosis of aorta: Secondary | ICD-10-CM | POA: Insufficient documentation

## 2020-02-09 LAB — GLUCOSE, CAPILLARY: Glucose-Capillary: 89 mg/dL (ref 70–99)

## 2020-02-09 MED ORDER — FLUDEOXYGLUCOSE F - 18 (FDG) INJECTION
6.6000 | Freq: Once | INTRAVENOUS | Status: AC | PRN
  Administered 2020-02-09: 6.77 via INTRAVENOUS

## 2020-02-11 ENCOUNTER — Other Ambulatory Visit: Payer: Self-pay

## 2020-02-11 ENCOUNTER — Inpatient Hospital Stay: Payer: Medicare Other

## 2020-02-11 ENCOUNTER — Encounter: Payer: Self-pay | Admitting: Internal Medicine

## 2020-02-11 ENCOUNTER — Inpatient Hospital Stay (HOSPITAL_BASED_OUTPATIENT_CLINIC_OR_DEPARTMENT_OTHER): Payer: Medicare Other | Admitting: Internal Medicine

## 2020-02-11 VITALS — BP 116/74 | HR 70 | Temp 97.5°F | Resp 18 | Wt 126.2 lb

## 2020-02-11 DIAGNOSIS — Z170 Estrogen receptor positive status [ER+]: Secondary | ICD-10-CM | POA: Diagnosis not present

## 2020-02-11 DIAGNOSIS — C7951 Secondary malignant neoplasm of bone: Secondary | ICD-10-CM | POA: Diagnosis not present

## 2020-02-11 DIAGNOSIS — C50412 Malignant neoplasm of upper-outer quadrant of left female breast: Secondary | ICD-10-CM | POA: Diagnosis not present

## 2020-02-11 NOTE — Assessment & Plan Note (Addendum)
#   Left breast cancer-T2N1-ER Pos; right breast DCIS; s/p bilateral mastectomies-currently on letrozole.  Question concern for recurrence- [incidental CT head]-sclerotic right parietal calvarium.  February 08, 2020-shows mild hilar adenopathy; otherwise no worsening bone metastases.  Unfortunately PET scan did not include calvarium [see below]  #For now would recommend continued letrozole.  I would recommend a bone scan/whole body in approximately 2 months  # A.fib- on eliquis-no evidence of bleeding.  Stable  #Dementia-stable.  # DISPOSITION:  # labs today # Follow up in 2 months MD- cbc/cmp; prior- bone shows-  Dr.B  # I reviewed the blood work- with the patient in detail; also reviewed the imaging independently [as summarized above]; and with the patient in detail.  '

## 2020-02-11 NOTE — Progress Notes (Signed)
[place] NOTE  Patient Care Team: Kirk Ruths, MD as PCP - General (Internal Medicine) Kirk Ruths, MD (Internal Medicine) Bary Castilla Forest Gleason, MD (General Surgery)  CHIEF COMPLAINTS/PURPOSE OF CONSULTATION: BREAST CANCER  #  Oncology History Overview Note  # RIGHT BREAST; SIMPLE MASTECTOMY:  - NEGATIVE FOR RESIDUAL DUCTAL CARCINOMA IN SITU.  - BIOPSY SITE CHANGES, MARKER MATERIAL PRESENT.  - SEE SUMMARY BELOW.   B. LEFT BREAST; SIMPLE MASTECTOMY:  - INVASIVE LOBULAR CARCINOMA, CLASSIC TYPE.  - THE DEEP MARGIN OF RESECTION IS INVOLVED.  - SEE SUMMARY BELOW.   C. RIGHT SENTINEL LYMPH NODE #1; EXCISION:  - NO TUMOR SEEN BY CYTOKERATIN STAIN IN ONE LYMPH NODE.   D. LEFT SENTINEL LYMPH NODE #1; EXCISION:  - MICROMETASTASIS IDENTIFIED BY CYTOKERATIN STAIN (1 MM) IN ONE LYMPH  NODE.   # SURVIVORSHIP:   # GENETICS:   DIAGNOSIS:   STAGE:         ;  GOALS:  CURRENT/MOST RECENT THERAPY :     Carcinoma of upper-outer quadrant of left breast in female, estrogen receptor positive ([place])  08/18/2015 Initial Diagnosis   Carcinoma of upper-outer quadrant of left breast in female, estrogen receptor positive ([place])      HISTORY OF PRESENTING ILLNESS:  Cassie Starch, Cassie Lopez 79 y.o.  female history of dementia ; and left-sided breast cancer; right-sided DCIS status post bilateral mastectomy-currently on letrozole is here for follow-up/review results of the PET scan.  Patient denies any worsening bone pain.  No new shortness of breath or cough.  Review of Systems  Constitutional: Negative for chills, diaphoresis, fever, malaise/fatigue and weight loss.  HENT: Negative for nosebleeds and sore throat.   Eyes: Negative for double vision.  Respiratory: Negative for cough, hemoptysis, sputum production, shortness of breath and wheezing.   Cardiovascular: Negative for chest pain, palpitations, orthopnea and leg swelling.  Gastrointestinal: Negative  for abdominal pain, blood in stool, constipation, diarrhea, heartburn, melena, nausea and vomiting.  Genitourinary: Negative for dysuria, frequency and urgency.  Musculoskeletal: Positive for back pain and joint pain.  Skin: Negative.  Negative for itching and rash.  Neurological: Negative for dizziness, tingling, focal weakness, weakness and headaches.  Endo/Heme/Allergies: Does not bruise/bleed easily.  Psychiatric/Behavioral: Positive for memory loss. Negative for depression. The patient has insomnia. The patient is not nervous/anxious.      MEDICAL HISTORY:  Past Medical History:  Diagnosis Date  . Atrial fibrillation ([place])   . Breast cancer ([place]) 2017   bilateral breast cancer  . Hypertension    OFF MEDS X 6 MONTHS DUE TO CONTROLLED BP (08-15-15)  . Osteoporosis     SURGICAL HISTORY: Past Surgical History:  Procedure Laterality Date  . ANKLE SURGERY Left 2011  . BILATERAL TOTAL MASTECTOMY WITH AXILLARY LYMPH NODE DISSECTION Bilateral 08/24/2015   Left T2, N56m; ER/PR +; Her 2 neu not overexpressed, Right: DCIS.   Marland Kitchen BREAST BIOPSY Right 08/08/15   stereo/DUCTAL CARCINOMA IN SITU   . BREAST BIOPSY Left 08-15-15   INVASIVE CARCINOMA, dr byrnett  . CATARACT EXTRACTION W/PHACO Left 04/10/2017   Procedure: CATARACT EXTRACTION PHACO AND INTRAOCULAR LENS PLACEMENT (IOC);  Surgeon: Leandrew Koyanagi, MD;  Location: [facility];  Service: Ophthalmology;  Laterality: Left;  [place] 01:13.3 AP% 14.8 CDE 10.81 Fluid Pack  lot # [id number]  . CATARACT EXTRACTION W/PHACO Right 05/14/2017   Procedure: CATARACT EXTRACTION PHACO AND INTRAOCULAR LENS PLACEMENT ([place])  RIGHT;  Surgeon: Leandrew Koyanagi, MD;  Location: [place];  Service: Ophthalmology;  Laterality: Right;  . FRACTURE SURGERY    . left breast excision Left   . MASTECTOMY Bilateral 08/24/2015  . SENTINEL NODE BIOPSY Bilateral 08/24/2015   Procedure: SENTINEL NODE BIOPSY;  Surgeon: Robert Bellow, MD;  Location: [facility];   Service: General;  Laterality: Bilateral;  . WRIST FRACTURE SURGERY      SOCIAL HISTORY: Social History   Socioeconomic History  . Marital status: Widowed    Spouse name: Not on file  . Number of children: Not on file  . Years of education: Not on file  . Highest education level: Not on file  Occupational History  . Not on file  Tobacco Use  . Smoking status: Never Smoker  . Smokeless tobacco: Never Used  Vaping Use  . Vaping Use: Never used  Substance and Sexual Activity  . Alcohol use: No    Alcohol/week: 0.0 standard drinks  . Drug use: No  . Sexual activity: Not on file  Other Topics Concern  . Not on file  Social History Narrative   [place] [used to live in independent living in April 2021]; never smoked; no alcohol;    Social Determinants of Health   Financial Resource Strain:   . Difficulty of Paying Living Expenses:   Food Insecurity:   . Worried About Charity fundraiser in the Last Year:   . Arboriculturist in the Last Year:   Transportation Needs:   . Film/video editor (Medical):   Marland Kitchen Lack of Transportation (Non-Medical):   Physical Activity:   . Days of Exercise per Week:   . Minutes of Exercise per Session:   Stress:   . Feeling of Stress :   Social Connections:   . Frequency of Communication with Friends and Family:   . Frequency of Social Gatherings with Friends and Family:   . Attends Religious Services:   . Active Member of Clubs or Organizations:   . Attends Archivist Meetings:   Marland Kitchen Marital Status:   Intimate Partner Violence:   . Fear of Current or Ex-Partner:   . Emotionally Abused:   Marland Kitchen Physically Abused:   . Sexually Abused:     FAMILY HISTORY: Family History  Problem Relation Age of Onset  . Cancer Neg Hx     ALLERGIES:  is allergic to bactericin [bacitracin].  MEDICATIONS:  Current Outpatient Medications  Medication Sig Dispense Refill  . apixaban (ELIQUIS) 5 MG TABS tablet Take 1  tablet (5 mg total) by mouth 2 (two) times daily. 120 tablet 1  . B Complex-Biotin-FA (SUPER QUINTS B-50) TABS Take by mouth.    . calcium carbonate (CALCIUM 600) 600 MG TABS tablet Take 1,200 mg by mouth daily with breakfast.     . Cholecalciferol (VITAMIN D3) 1000 units CAPS Take 2,000 Units by mouth daily.     Marland Kitchen diltiazem (CARDIZEM CD) 240 MG 24 hr capsule Take 1 capsule (240 mg total) by mouth daily. 30 capsule 0  . letrozole (FEMARA) 2.5 MG tablet Take 1 tablet (2.5 mg total) by mouth daily. 90 tablet 3  . mirtazapine (REMERON) 15 MG tablet Take 15 mg by mouth at bedtime.    . Omega-3 Fatty Acids (FISH OIL) 1000 MG CAPS Take 1,000 mg by mouth daily.     . QUEtiapine (SEROQUEL) 25 MG tablet Take 25 mg by mouth at bedtime.    . Selenium 200 MCG CAPS Take 200 mcg by mouth daily.     Marland Kitchen  aspirin 81 MG tablet Take 81 mg by mouth daily. (Patient not taking: Reported on 02/11/2020)     No current facility-administered medications for this visit.      Marland Kitchen  PHYSICAL EXAMINATION: ECOG PERFORMANCE STATUS: 0 - Asymptomatic  Vitals:   02/11/20 0950  BP: 116/74  Pulse: 70  Resp: 18  Temp: (!) 97.5 F (36.4 C)  SpO2: 100%   Filed Weights   02/11/20 0950  Weight: 126 lb 3.2 oz (57.2 kg)    Physical Exam Constitutional:      Comments: Frail-appearing Caucasian female patient.  She is walking independently.  Accompanied by caregiver.  HENT:     Head: Normocephalic and atraumatic.     Mouth/Throat:     Pharynx: No oropharyngeal exudate.  Eyes:     Pupils: Pupils are equal, round, and reactive to light.  Cardiovascular:     Rate and Rhythm: Normal rate and regular rhythm.  Pulmonary:     Effort: Pulmonary effort is normal. No respiratory distress.     Breath sounds: Normal breath sounds. No wheezing.  Abdominal:     General: Bowel sounds are normal. There is no distension.     Palpations: Abdomen is soft. There is no mass.     Tenderness: There is no abdominal tenderness. There is no  guarding or rebound.  Musculoskeletal:        General: No tenderness. Normal range of motion.     Cervical back: Normal range of motion and neck supple.  Skin:    General: Skin is warm.  Neurological:     Mental Status: She is alert and oriented to person, place, and time.  Psychiatric:        Mood and Affect: Affect normal.      LABORATORY DATA:  I have reviewed the data as listed Lab Results  Component Value Date   WBC 4.5 01/28/2020   HGB 12.0 01/28/2020   HCT 35.8 (L) 01/28/2020   MCV 88.2 01/28/2020   PLT 252 01/28/2020   Recent Labs    11/10/19 1004 11/30/19 1050 12/02/19 0416 12/03/19 0534 01/28/20 1220  NA 141   < > 135 135 141  K 3.5   < > 3.2* 4.0 3.0*  CL 107   < > 100 101 104  CO2 23   < > 24 24 28   GLUCOSE 118*   < > 106* 104* 104*  BUN 20   < > 32* 39* 18  CREATININE 0.98   < > 0.83 0.87 0.66  CALCIUM 9.6   < > 8.6* 8.6* 8.4*  GFRNONAA 54*   < > >60 >60 >60  GFRAA >60   < > >60 >60 >60  PROT 7.8  --   --   --  6.5  ALBUMIN 4.7  --   --   --  3.3*  AST 53*  --   --   --  30  ALT 61*  --   --   --  21  ALKPHOS 55  --   --   --  67  BILITOT 1.2  --   --   --  0.7   < > = values in this interval not displayed.    RADIOGRAPHIC STUDIES: I have personally reviewed the radiological images as listed and agreed with the findings in the report. NM PET Image Initial (PI) Skull Base To Thigh  Result Date: 02/09/2020 CLINICAL DATA:  Initial treatment strategy for breast cancer. EXAM: NUCLEAR MEDICINE PET  SKULL BASE TO THIGH TECHNIQUE: 6.8 mCi F-18 FDG was injected intravenously. Full-ring PET imaging was performed from the skull base to thigh after the radiotracer. CT data was obtained and used for attenuation correction and anatomic localization. Fasting blood glucose: 89 mg/dl COMPARISON:  None. FINDINGS: Mediastinal blood pool activity: SUV max 1.8 Liver activity: SUV max NA NECK: No abnormal hypermetabolism. Incidental CT findings: None. CHEST: Low right  paratracheal lymph node measures 8 mm (3/85) with an SUV max of 2.6. AP window lymph nodes measure up to 8 mm (3/81) with an SUV max of 2.5. 7 mm subcarinal lymph node has an SUV max of 2.8. Focal hypermetabolism in the left hilum has an SUV max of 3.1 but no definite CT correlate. Otherwise, no hypermetabolic lymph nodes or pulmonary nodules. Incidental CT findings: 2.3 cm low-attenuation left thyroid nodule. Atherosclerotic calcification of the aorta. Heart is enlarged. Small amount of pericardial fluid is likely physiologic. Small bilateral pleural effusions. ABDOMEN/PELVIS: No abnormal hypermetabolism in the liver, adrenal glands, spleen or pancreas. No hypermetabolic lymph nodes. Incidental CT findings: Liver, gallbladder, adrenal glands and kidneys are unremarkable. There may be a subcentimeter low-attenuation lesion in the spleen, too small to characterize. Pancreas, stomach and bowel are grossly unremarkable. Atherosclerotic calcification of the aorta. SKELETON: No abnormal osseous hypermetabolism. Degenerative uptake at the right sternoclavicular junction. Incidental CT findings: Degenerative changes in the spine. IMPRESSION: 1. Mildly hypermetabolic mediastinal lymph nodes, and possibly left hilar lymph node, are nonspecific but may be reactive in the setting of small bilateral pleural effusions. Difficult to definitively exclude metastatic disease. 2. Otherwise, no evidence of metastatic disease. 3. Small bilateral pleural effusions. 4. Left thyroid nodule. Recommend thyroid US, as clinically indicated (ref: J Am Coll Radiol. 2015 Feb;12(2): 143-50). 5.  Aortic atherosclerosis (ICD10-I70.0). Electronically Signed   By: Lorin Picket M.D.   On: 02/09/2020 14:47    ASSESSMENT & PLAN:   Carcinoma of upper-outer quadrant of left breast in female, estrogen receptor positive ([place]) # Left breast cancer-T2N1-ER Pos; right breast DCIS; s/p bilateral mastectomies-currently on letrozole.  Question concern  for recurrence- [incidental CT head]-sclerotic right parietal calvarium.  February 08, 2020-shows mild hilar adenopathy; otherwise no worsening bone metastases.  Unfortunately PET scan did not include calvarium [see below]  #For now would recommend continued letrozole.  I would recommend a bone scan/whole body in approximately 2 months  # A.fib- on eliquis-no evidence of bleeding.  Stable  #Dementia-stable.  # DISPOSITION:  # labs today # Follow up in 2 months MD- cbc/cmp; prior- bone shows-  Dr.B  # I reviewed the blood work- with the patient in detail; also reviewed the imaging independently [as summarized above]; and with the patient in detail.  '    All questions were answered. The patient knows to call the clinic with any problems, questions or concerns.    Cammie Sickle, MD 02/13/2020 10:33 PM

## 2020-02-23 ENCOUNTER — Other Ambulatory Visit: Payer: Medicare Other

## 2020-02-25 ENCOUNTER — Other Ambulatory Visit: Payer: Medicare Other

## 2020-02-25 ENCOUNTER — Ambulatory Visit: Payer: Medicare Other | Admitting: Internal Medicine

## 2020-04-07 ENCOUNTER — Encounter: Admission: RE | Admit: 2020-04-07 | Payer: Medicare Other | Source: Ambulatory Visit

## 2020-04-12 ENCOUNTER — Encounter
Admission: RE | Admit: 2020-04-12 | Discharge: 2020-04-12 | Disposition: A | Discharge status: Home / Self Care | Payer: Medicare Other | Source: Ambulatory Visit | Attending: Internal Medicine | Admitting: Internal Medicine

## 2020-04-12 ENCOUNTER — Inpatient Hospital Stay: Payer: Medicare Other | Attending: Internal Medicine

## 2020-04-12 ENCOUNTER — Ambulatory Visit
Admission: RE | Admit: 2020-04-12 | Discharge: 2020-04-12 | Disposition: A | Discharge status: Home / Self Care | Payer: Medicare Other | Source: Ambulatory Visit | Attending: Internal Medicine | Admitting: Internal Medicine

## 2020-04-12 ENCOUNTER — Other Ambulatory Visit: Payer: Self-pay

## 2020-04-12 ENCOUNTER — Encounter: Payer: Self-pay | Admitting: Internal Medicine

## 2020-04-12 ENCOUNTER — Inpatient Hospital Stay (HOSPITAL_BASED_OUTPATIENT_CLINIC_OR_DEPARTMENT_OTHER): Payer: Medicare Other | Admitting: Internal Medicine

## 2020-04-12 DIAGNOSIS — C50412 Malignant neoplasm of upper-outer quadrant of left female breast: Secondary | ICD-10-CM | POA: Insufficient documentation

## 2020-04-12 DIAGNOSIS — Z7901 Long term (current) use of anticoagulants: Secondary | ICD-10-CM | POA: Diagnosis not present

## 2020-04-12 DIAGNOSIS — Z170 Estrogen receptor positive status [ER+]: Secondary | ICD-10-CM | POA: Insufficient documentation

## 2020-04-12 DIAGNOSIS — C7951 Secondary malignant neoplasm of bone: Secondary | ICD-10-CM | POA: Diagnosis present

## 2020-04-12 DIAGNOSIS — D0511 Intraductal carcinoma in situ of right breast: Secondary | ICD-10-CM | POA: Diagnosis not present

## 2020-04-12 DIAGNOSIS — Z9013 Acquired absence of bilateral breasts and nipples: Secondary | ICD-10-CM | POA: Diagnosis not present

## 2020-04-12 DIAGNOSIS — F0390 Unspecified dementia without behavioral disturbance: Secondary | ICD-10-CM | POA: Insufficient documentation

## 2020-04-12 LAB — COMPREHENSIVE METABOLIC PANEL
ALT: 14 U/L (ref 0–44)
AST: 20 U/L (ref 15–41)
Albumin: 4 g/dL (ref 3.5–5.0)
Alkaline Phosphatase: 52 U/L (ref 38–126)
Anion gap: 10 (ref 5–15)
BUN: 23 mg/dL (ref 8–23)
CO2: 30 mmol/L (ref 22–32)
Calcium: 9.1 mg/dL (ref 8.9–10.3)
Chloride: 104 mmol/L (ref 98–111)
Creatinine, Ser: 1.09 mg/dL — ABNORMAL HIGH (ref 0.44–1.00)
GFR calc Af Amer: 55 mL/min — ABNORMAL LOW (ref >60)
GFR calc non Af Amer: 48 mL/min — ABNORMAL LOW (ref >60)
Glucose, Bld: 82 mg/dL (ref 70–99)
Potassium: 3.5 mmol/L (ref 3.5–5.1)
Sodium: 144 mmol/L (ref 135–145)
Total Bilirubin: 0.7 mg/dL (ref 0.3–1.2)
Total Protein: 7 g/dL (ref 6.5–8.1)

## 2020-04-12 LAB — CBC WITH DIFFERENTIAL/PLATELET
Abs Immature Granulocytes: 0.01 10*3/uL (ref 0.00–0.07)
Basophils Absolute: 0.1 10*3/uL (ref 0.0–0.1)
Basophils Relative: 1 %
Eosinophils Absolute: 0.4 10*3/uL (ref 0.0–0.5)
Eosinophils Relative: 10 %
HCT: 35 % — ABNORMAL LOW (ref 36.0–46.0)
Hemoglobin: 11.8 g/dL — ABNORMAL LOW (ref 12.0–15.0)
Immature Granulocytes: 0 %
Lymphocytes Relative: 27 %
Lymphs Abs: 1 10*3/uL (ref 0.7–4.0)
MCH: 31.1 pg (ref 26.0–34.0)
MCHC: 33.7 g/dL (ref 30.0–36.0)
MCV: 92.3 fL (ref 80.0–100.0)
Monocytes Absolute: 0.4 10*3/uL (ref 0.1–1.0)
Monocytes Relative: 11 %
Neutro Abs: 1.9 10*3/uL (ref 1.7–7.7)
Neutrophils Relative %: 51 %
Platelets: 213 10*3/uL (ref 150–400)
RBC: 3.79 MIL/uL — ABNORMAL LOW (ref 3.87–5.11)
RDW: 13.8 % (ref 11.5–15.5)
WBC: 3.8 10*3/uL — ABNORMAL LOW (ref 4.0–10.5)
nRBC: 0 % (ref 0.0–0.2)

## 2020-04-12 MED ORDER — TECHNETIUM TC 99M MEDRONATE IV KIT
20.0000 | PACK | Freq: Once | INTRAVENOUS | Status: AC | PRN
  Administered 2020-04-12: 23.44 via INTRAVENOUS

## 2020-04-12 NOTE — Assessment & Plan Note (Addendum)
#   Left breast cancer-T2N1-ER Pos; right breast DCIS; s/p bilateral mastectomies-currently on letrozole.  Question concern for recurrence- [incidental CT head]-sclerotic right parietal calvarium. PET scan- February 08, 2020-shows mild hilar adenopathy; otherwise no worsening bone metastases.   Awaiting Bone scan today.  Otherwise overall clinically STABLE.  #Continue Femara.  Tolerating well no concerns for any major side effects.  # A.fib- on eliquis-no evidence of bleeding.  Stable  #Dementia- STABLE.    # DISPOSITION: will call with results- [phone] # Follow up in 6 months MD- cbc/cmp-  Dr.B  # SEP 28th, 2021-mild focal uptake parietal calvarium suspicious for osseous metastases; no other metastatic disease noted.  Given the absence of any significant residual disease I think is reasonable to continue close monitoring at this time.  Not recommend changing any therapies; for any additional imaging at this time.  Patient will be informed.  Cc' [place].

## 2020-04-12 NOTE — Progress Notes (Signed)
Pt and caregiver from [facility] are in today for follow up.  Pt denies any concerns or difficulties today.

## 2020-04-12 NOTE — Progress Notes (Signed)
[place] NOTE  Patient Care Team: Kirk Ruths, MD as PCP - General (Internal Medicine) Kirk Ruths, MD (Internal Medicine) Bary Castilla Forest Gleason, MD (General Surgery)  CHIEF COMPLAINTS/PURPOSE OF CONSULTATION: BREAST CANCER  #  Oncology History Overview Note  # RIGHT BREAST; SIMPLE MASTECTOMY:  - NEGATIVE FOR RESIDUAL DUCTAL CARCINOMA IN SITU.  - BIOPSY SITE CHANGES, MARKER MATERIAL PRESENT.  - SEE SUMMARY BELOW.   B. LEFT BREAST; SIMPLE MASTECTOMY:  - INVASIVE LOBULAR CARCINOMA, CLASSIC TYPE.  - THE DEEP MARGIN OF RESECTION IS INVOLVED.  - SEE SUMMARY BELOW.   C. RIGHT SENTINEL LYMPH NODE #1; EXCISION:  - NO TUMOR SEEN BY CYTOKERATIN STAIN IN ONE LYMPH NODE.   D. LEFT SENTINEL LYMPH NODE #1; EXCISION:  - MICROMETASTASIS IDENTIFIED BY CYTOKERATIN STAIN (1 MM) IN ONE LYMPH  NODE.   # [id number]-incidental parietal-right sclerotic lesion [CT brain]; July 2021-PET scan no residual/bony metastases; did not image the skull.  September 2021-possible focal osseous metastases in parietal skull.   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  female history of dementia ; and left-sided breast cancer; right-sided DCIS status post bilateral mastectomy-currently on letrozole is here for follow-up.  Patient is due for a bone scan later in the afternoon.  Patient appetite is good.  No weight loss.  Nausea no vomiting.  No new shortness of breath or cough.   Review of Systems  Constitutional: Negative for chills, diaphoresis, fever, malaise/fatigue and weight loss.  HENT: Negative for nosebleeds and  sore throat.   Eyes: Negative for double vision.  Respiratory: Negative for cough, hemoptysis, sputum production, shortness of breath and wheezing.   Cardiovascular: Negative for chest pain, palpitations, orthopnea and leg swelling.  Gastrointestinal: Negative for abdominal pain, blood in stool, constipation, diarrhea, heartburn, melena, nausea and vomiting.  Genitourinary: Negative for dysuria, frequency and urgency.  Musculoskeletal: Positive for back pain and joint pain.  Skin: Negative.  Negative for itching and rash.  Neurological: Negative for dizziness, tingling, focal weakness, weakness and headaches.  Endo/Heme/Allergies: Does not bruise/bleed easily.  Psychiatric/Behavioral: Positive for memory loss. Negative for depression. The patient has insomnia. The patient is not nervous/anxious.      MEDICAL HISTORY:  Past Medical History:  Diagnosis Date  . Atrial fibrillation ([place])   . Breast cancer ([place]) 2017   bilateral breast cancer  . Hypertension    OFF MEDS X 6 MONTHS DUE TO CONTROLLED BP (08-15-15)  . Osteoporosis     SURGICAL HISTORY: Past Surgical History:  Procedure Laterality Date  . ANKLE SURGERY Left 2011  . BILATERAL TOTAL MASTECTOMY WITH AXILLARY LYMPH NODE DISSECTION Bilateral 08/24/2015   Left T2, N15m; ER/PR +; Her 2 neu not overexpressed, Right: DCIS.   Marland Kitchen BREAST BIOPSY Right 08/08/15   stereo/DUCTAL CARCINOMA IN SITU   . BREAST BIOPSY Left 08-15-15   INVASIVE CARCINOMA, dr byrnett  . CATARACT EXTRACTION W/PHACO Left 04/10/2017   Procedure: CATARACT EXTRACTION PHACO AND INTRAOCULAR LENS PLACEMENT (IOC);  Surgeon: Leandrew Koyanagi, MD;  Location: [facility];  Service: Ophthalmology;  Laterality: Left;  [place] 01:13.3 AP% 14.8 CDE 10.81 Fluid Pack  lot # [id number]  . CATARACT EXTRACTION W/PHACO Right 05/14/2017   Procedure: CATARACT EXTRACTION PHACO AND INTRAOCULAR LENS PLACEMENT ([place])  RIGHT;  Surgeon: Leandrew Koyanagi, MD;  Location: [facility];   Service: Ophthalmology;  Laterality: Right;  . FRACTURE SURGERY    . left breast excision Left   . MASTECTOMY Bilateral 08/24/2015  . SENTINEL NODE BIOPSY Bilateral 08/24/2015   Procedure: SENTINEL NODE BIOPSY;  Surgeon: Robert Bellow, MD;  Location: [facility];  Service: General;  Laterality: Bilateral;  . WRIST FRACTURE SURGERY      SOCIAL HISTORY: Social History   Socioeconomic History  . Marital status: Widowed    Spouse name: Not on file  . Number of children: Not on file  . Years of education: Not on file  . Highest education level: Not on file  Occupational History  . Not on file  Tobacco Use  . Smoking status: Never Smoker  . Smokeless tobacco: Never Used  Vaping Use  . Vaping Use: Never used  Substance and Sexual Activity  . Alcohol use: No    Alcohol/week: 0.0 standard drinks  . Drug use: No  . Sexual activity: Not on file  Other Topics Concern  . Not on file  Social History Narrative   [place] [used to live in independent living in April 2021]; never smoked; no alcohol;    Social Determinants of Health   Financial Resource Strain:   . Difficulty of Paying Living Expenses: Not on file  Food Insecurity:   . Worried About Charity fundraiser in the Last Year: Not on file  . Ran Out of Food in the Last Year: Not on file  Transportation Needs:   . Lack of Transportation (Medical): Not on file  . Lack of Transportation (Non-Medical): Not on file  Physical Activity:   . Days of Exercise per Week: Not on file  . Minutes of Exercise per Session: Not on file  Stress:   . Feeling of Stress : Not on file  Social Connections:   . Frequency of Communication with Friends and Family: Not on file  . Frequency of Social Gatherings with Friends and Family: Not on file  . Attends Religious Services: Not on file  . Active Member of Clubs or Organizations: Not on file  . Attends Archivist Meetings: Not on file  . Marital Status: Not on  file  Intimate Partner Violence:   . Fear of Current or Ex-Partner: Not on file  . Emotionally Abused: Not on file  . Physically Abused: Not on file  . Sexually Abused: Not on file    FAMILY HISTORY: Family History  Problem Relation Age of Onset  . Cancer Neg Hx     ALLERGIES:  is allergic to bactericin [bacitracin].  MEDICATIONS:  Current Outpatient Medications  Medication Sig Dispense Refill  . aspirin 81 MG tablet Take 81 mg by mouth daily.     . B Complex-Biotin-FA (SUPER QUINTS B-50) TABS Take by mouth.    . calcium carbonate (CALCIUM 600) 600 MG TABS tablet Take 1,200 mg by mouth daily with breakfast.     . Cholecalciferol (VITAMIN D3) 1000 units CAPS Take 2,000 Units by mouth daily.     Marland Kitchen diltiazem (CARDIZEM CD) 240 MG 24 hr capsule Take 1 capsule (240 mg total) by mouth daily. 30 capsule 0  . furosemide (LASIX) 20 MG tablet Take 20 mg by mouth daily.    Marland Kitchen  letrozole (FEMARA) 2.5 MG tablet Take 1 tablet (2.5 mg total) by mouth daily. 90 tablet 3  . Omega-3 Fatty Acids (FISH OIL) 1000 MG CAPS Take 1,000 mg by mouth daily.     . QUEtiapine (SEROQUEL) 25 MG tablet Take 25 mg by mouth at bedtime.    . Selenium 200 MCG CAPS Take 200 mcg by mouth daily.     Marland Kitchen apixaban (ELIQUIS) 5 MG TABS tablet Take 1 tablet (5 mg total) by mouth 2 (two) times daily. 120 tablet 1  . mirtazapine (REMERON) 15 MG tablet Take 15 mg by mouth at bedtime. (Patient not taking: Reported on 04/12/2020)     No current facility-administered medications for this visit.      Marland Kitchen  PHYSICAL EXAMINATION: ECOG PERFORMANCE STATUS: 0 - Asymptomatic  Vitals:   04/12/20 1019  BP: 134/85  Pulse: 72  Resp: 18  Temp: 97.9 F (36.6 C)  SpO2: 100%   Filed Weights   04/12/20 1019  Weight: 135 lb 3.2 oz (61.3 kg)    Physical Exam Constitutional:      Comments: Frail-appearing Caucasian female patient.  She is walking independently.  Accompanied by caregiver.  HENT:     Head: Normocephalic and atraumatic.      Mouth/Throat:     Pharynx: No oropharyngeal exudate.  Eyes:     Pupils: Pupils are equal, round, and reactive to light.  Cardiovascular:     Rate and Rhythm: Normal rate and regular rhythm.  Pulmonary:     Effort: Pulmonary effort is normal. No respiratory distress.     Breath sounds: Normal breath sounds. No wheezing.  Abdominal:     General: Bowel sounds are normal. There is no distension.     Palpations: Abdomen is soft. There is no mass.     Tenderness: There is no abdominal tenderness. There is no guarding or rebound.  Musculoskeletal:        General: No tenderness. Normal range of motion.     Cervical back: Normal range of motion and neck supple.  Skin:    General: Skin is warm.  Neurological:     Mental Status: She is alert and oriented to person, place, and time.  Psychiatric:        Mood and Affect: Affect normal.      LABORATORY DATA:  I have reviewed the data as listed Lab Results  Component Value Date   WBC 3.8 (L) 04/12/2020   HGB 11.8 (L) 04/12/2020   HCT 35.0 (L) 04/12/2020   MCV 92.3 04/12/2020   PLT 213 04/12/2020   Recent Labs    11/10/19 1004 11/30/19 1050 12/03/19 0534 01/28/20 1220 04/12/20 0953  NA 141   < > 135 141 144  K 3.5   < > 4.0 3.0* 3.5  CL 107   < > 101 104 104  CO2 23   < > 24 28 30   GLUCOSE 118*   < > 104* 104* 82  BUN 20   < > 39* 18 23  CREATININE 0.98   < > 0.87 0.66 1.09*  CALCIUM 9.6   < > 8.6* 8.4* 9.1  GFRNONAA 54*   < > >60 >60 48*  GFRAA >60   < > >60 >60 55*  PROT 7.8  --   --  6.5 7.0  ALBUMIN 4.7  --   --  3.3* 4.0  AST 53*  --   --  30 20  ALT 61*  --   --  21 14  ALKPHOS 55  --   --  67 52  BILITOT 1.2  --   --  0.7 0.7   < > = values in this interval not displayed.    RADIOGRAPHIC STUDIES: I have personally reviewed the radiological images as listed and agreed with the findings in the report. NM Bone Scan Whole Body  Result Date: 04/14/2020 CLINICAL DATA:  Breast cancer, calvarial sclerosis EXAM:  NUCLEAR MEDICINE WHOLE BODY BONE SCAN TECHNIQUE: Whole body anterior and posterior images were obtained approximately 3 hours after intravenous injection of radiopharmaceutical. RADIOPHARMACEUTICALS:  23.4 mCi Technetium-46m MDP IV COMPARISON:  None Correlation: PET-CT 02/09/2020, CT head 12/01/2019 FINDINGS: Increased uptake at shoulders, sternoclavicular joints, RIGHT knee, feet, typically degenerative. Uptake at RIGHT lateral aspect of lumbar spine along the concave border of dextroconvex scoliosis, likely degenerative and corresponding to degenerative disc/facet disease changes on CT. Focal increased tracer accumulation at the RIGHT parietal calvarium concerning for osseous metastasis No additional sites of worrisome osseous tracer accumulation are identified. Extensive costochondral uptake of tracer, benign. Expected urinary tract and soft tissue distribution of tracer. IMPRESSION: Focal abnormal increased tracer uptake at RIGHT parietal calvarium suspicious for osseous metastasis and corresponding to sclerotic lesion on CT. Additional scattered degenerative type changes without additional worrisome scintigraphic findings. Electronically Signed   By: Lavonia Dana M.D.   On: 04/14/2020 12:21    ASSESSMENT & PLAN:   Carcinoma of upper-outer quadrant of left breast in female, estrogen receptor positive ([place]) # Left breast cancer-T2N1-ER Pos; right breast DCIS; s/p bilateral mastectomies-currently on letrozole.  Question concern for recurrence- [incidental CT head]-sclerotic right parietal calvarium. PET scan- February 08, 2020-shows mild hilar adenopathy; otherwise no worsening bone metastases.   Awaiting Bone scan today.  Otherwise overall clinically STABLE.  #Continue Femara.  Tolerating well no concerns for any major side effects.  # A.fib- on eliquis-no evidence of bleeding.  Stable  #Dementia- STABLE.    # DISPOSITION: will call with results- [phone] # Follow up in 6 months MD- cbc/cmp-   Dr.B  # SEP 28th, 2021-mild focal uptake parietal calvarium suspicious for osseous metastases; no other metastatic disease noted.  Given the absence of any significant residual disease I think is reasonable to continue close monitoring at this time.  Not recommend changing any therapies; for any additional imaging at this time.  Patient will be informed.  Cc' New Home.      All questions were answered. The patient knows to call the clinic with any problems, questions or concerns.    Cammie Sickle, MD 04/14/2020 3:28 PM

## 2020-04-13 ENCOUNTER — Other Ambulatory Visit: Payer: Medicare Other

## 2020-04-13 ENCOUNTER — Ambulatory Visit: Payer: Medicare Other | Admitting: Internal Medicine

## 2020-04-16 ENCOUNTER — Telehealth: Payer: Self-pay | Admitting: Internal Medicine

## 2020-04-16 NOTE — Telephone Encounter (Signed)
On 10/02-unable to reach patient -phone rings; cannot leave a voicemail.  To message for patient's sister-[name]-asking call back to discuss patient's results of the bone scan

## 2020-04-17 ENCOUNTER — Telehealth: Payer: Self-pay | Admitting: Internal Medicine

## 2020-04-17 NOTE — Telephone Encounter (Signed)
Again on 10/04-unable to reach patient -phone rings; cannot leave a voicemail.  Again I LVM for patient's sister-[name]-asking call back to discuss patient's results of the bone scan.  H/T-please reach out to patient's nursing facility/RN.  I would like to discuss regarding patient's bone scan results.

## 2020-04-18 NOTE — Telephone Encounter (Signed)
9 am Per Hassan Rowan, RN - Moise Boring returning Dr Sharmaine Base call [phone]

## 2020-04-19 ENCOUNTER — Telehealth: Payer: Self-pay | Admitting: Internal Medicine

## 2020-04-19 NOTE — Telephone Encounter (Signed)
On 10/05-finally able to speak to patient's sister Moise Boring regarding the results of the September 2021 bone scan-question osseous metastases versus others-quite subtle.  PET scan July 2021 - for any obvious metastatic disease.   Given underlying patient's dementia/failure status recommend surveillance at this time continue letrozole.  Follow-up as planned.

## 2020-05-25 ENCOUNTER — Encounter
Admission: RE | Admit: 2020-05-25 | Discharge: 2020-05-25 | Disposition: A | Discharge status: Home / Self Care | Payer: Medicare Other | Source: Ambulatory Visit | Attending: Internal Medicine | Admitting: Internal Medicine

## 2020-10-11 ENCOUNTER — Inpatient Hospital Stay: Payer: Medicare Other

## 2020-10-11 ENCOUNTER — Inpatient Hospital Stay: Payer: Medicare Other | Admitting: Internal Medicine

## 2020-11-03 ENCOUNTER — Other Ambulatory Visit: Payer: Self-pay

## 2020-11-03 DIAGNOSIS — C7951 Secondary malignant neoplasm of bone: Secondary | ICD-10-CM

## 2020-11-03 DIAGNOSIS — C50412 Malignant neoplasm of upper-outer quadrant of left female breast: Secondary | ICD-10-CM

## 2020-11-03 DIAGNOSIS — Z79811 Long term (current) use of aromatase inhibitors: Secondary | ICD-10-CM

## 2020-11-03 DIAGNOSIS — Z170 Estrogen receptor positive status [ER+]: Secondary | ICD-10-CM

## 2020-11-27 ENCOUNTER — Other Ambulatory Visit: Payer: Medicare Other

## 2021-01-03 ENCOUNTER — Inpatient Hospital Stay: Payer: Medicare Other | Admitting: Internal Medicine

## 2021-01-03 ENCOUNTER — Inpatient Hospital Stay: Payer: Medicare Other | Attending: Internal Medicine

## 2022-02-01 ENCOUNTER — Emergency Department: Payer: Medicare Other

## 2022-02-01 ENCOUNTER — Inpatient Hospital Stay
Admission: EM | Admit: 2022-02-01 | Discharge: 2022-02-04 | DRG: 481 | Disposition: A | Discharge status: Skilled Nursing Facility | Payer: Medicare Other | Source: Skilled Nursing Facility | Attending: Obstetrics and Gynecology | Admitting: Obstetrics and Gynecology

## 2022-02-01 ENCOUNTER — Other Ambulatory Visit: Payer: Self-pay

## 2022-02-01 DIAGNOSIS — S72002A Fracture of unspecified part of neck of left femur, initial encounter for closed fracture: Principal | ICD-10-CM | POA: Diagnosis present

## 2022-02-01 DIAGNOSIS — Z9842 Cataract extraction status, left eye: Secondary | ICD-10-CM

## 2022-02-01 DIAGNOSIS — M80852A Other osteoporosis with current pathological fracture, left femur, initial encounter for fracture: Principal | ICD-10-CM | POA: Diagnosis present

## 2022-02-01 DIAGNOSIS — Z961 Presence of intraocular lens: Secondary | ICD-10-CM | POA: Diagnosis present

## 2022-02-01 DIAGNOSIS — Z853 Personal history of malignant neoplasm of breast: Secondary | ICD-10-CM

## 2022-02-01 DIAGNOSIS — I10 Essential (primary) hypertension: Secondary | ICD-10-CM | POA: Diagnosis present

## 2022-02-01 DIAGNOSIS — C50911 Malignant neoplasm of unspecified site of right female breast: Secondary | ICD-10-CM | POA: Diagnosis present

## 2022-02-01 DIAGNOSIS — Z9841 Cataract extraction status, right eye: Secondary | ICD-10-CM

## 2022-02-01 DIAGNOSIS — Z66 Do not resuscitate: Secondary | ICD-10-CM | POA: Diagnosis present

## 2022-02-01 DIAGNOSIS — N1832 Chronic kidney disease, stage 3b: Secondary | ICD-10-CM | POA: Diagnosis present

## 2022-02-01 DIAGNOSIS — I4891 Unspecified atrial fibrillation: Secondary | ICD-10-CM | POA: Diagnosis present

## 2022-02-01 DIAGNOSIS — E876 Hypokalemia: Secondary | ICD-10-CM | POA: Diagnosis present

## 2022-02-01 DIAGNOSIS — Z9013 Acquired absence of bilateral breasts and nipples: Secondary | ICD-10-CM

## 2022-02-01 DIAGNOSIS — F329 Major depressive disorder, single episode, unspecified: Secondary | ICD-10-CM | POA: Diagnosis present

## 2022-02-01 DIAGNOSIS — Z881 Allergy status to other antibiotic agents status: Secondary | ICD-10-CM

## 2022-02-01 DIAGNOSIS — Z170 Estrogen receptor positive status [ER+]: Secondary | ICD-10-CM

## 2022-02-01 DIAGNOSIS — F0393 Unspecified dementia, unspecified severity, with mood disturbance: Secondary | ICD-10-CM | POA: Diagnosis present

## 2022-02-01 DIAGNOSIS — Z79811 Long term (current) use of aromatase inhibitors: Secondary | ICD-10-CM

## 2022-02-01 DIAGNOSIS — I5032 Chronic diastolic (congestive) heart failure: Secondary | ICD-10-CM | POA: Diagnosis present

## 2022-02-01 DIAGNOSIS — I130 Hypertensive heart and chronic kidney disease with heart failure and stage 1 through stage 4 chronic kidney disease, or unspecified chronic kidney disease: Secondary | ICD-10-CM | POA: Diagnosis present

## 2022-02-01 DIAGNOSIS — I4821 Permanent atrial fibrillation: Secondary | ICD-10-CM | POA: Diagnosis present

## 2022-02-01 DIAGNOSIS — C50912 Malignant neoplasm of unspecified site of left female breast: Secondary | ICD-10-CM | POA: Diagnosis present

## 2022-02-01 DIAGNOSIS — N182 Chronic kidney disease, stage 2 (mild): Secondary | ICD-10-CM

## 2022-02-01 DIAGNOSIS — Z7901 Long term (current) use of anticoagulants: Secondary | ICD-10-CM

## 2022-02-01 DIAGNOSIS — W010XXA Fall on same level from slipping, tripping and stumbling without subsequent striking against object, initial encounter: Secondary | ICD-10-CM | POA: Diagnosis present

## 2022-02-01 DIAGNOSIS — M810 Age-related osteoporosis without current pathological fracture: Secondary | ICD-10-CM | POA: Diagnosis present

## 2022-02-01 DIAGNOSIS — F0390 Unspecified dementia without behavioral disturbance: Secondary | ICD-10-CM | POA: Diagnosis present

## 2022-02-01 LAB — SURGICAL PCR SCREEN
MRSA, PCR: NEGATIVE
Staphylococcus aureus: NEGATIVE

## 2022-02-01 LAB — CBC WITH DIFFERENTIAL/PLATELET
Abs Immature Granulocytes: 0.02 10*3/uL (ref 0.00–0.07)
Basophils Absolute: 0 10*3/uL (ref 0.0–0.1)
Basophils Relative: 0 %
Eosinophils Absolute: 0.3 10*3/uL (ref 0.0–0.5)
Eosinophils Relative: 3 %
HCT: 41.9 % (ref 36.0–46.0)
Hemoglobin: 13.9 g/dL (ref 12.0–15.0)
Immature Granulocytes: 0 %
Lymphocytes Relative: 12 %
Lymphs Abs: 0.9 10*3/uL (ref 0.7–4.0)
MCH: 30.2 pg (ref 26.0–34.0)
MCHC: 33.2 g/dL (ref 30.0–36.0)
MCV: 91.1 fL (ref 80.0–100.0)
Monocytes Absolute: 0.3 10*3/uL (ref 0.1–1.0)
Monocytes Relative: 4 %
Neutro Abs: 5.9 10*3/uL (ref 1.7–7.7)
Neutrophils Relative %: 81 %
Platelets: 199 10*3/uL (ref 150–400)
RBC: 4.6 MIL/uL (ref 3.87–5.11)
RDW: 14 % (ref 11.5–15.5)
WBC: 7.4 10*3/uL (ref 4.0–10.5)
nRBC: 0 % (ref 0.0–0.2)

## 2022-02-01 LAB — BASIC METABOLIC PANEL
Anion gap: 9 (ref 5–15)
BUN: 21 mg/dL (ref 8–23)
CO2: 26 mmol/L (ref 22–32)
Calcium: 8.8 mg/dL — ABNORMAL LOW (ref 8.9–10.3)
Chloride: 106 mmol/L (ref 98–111)
Creatinine, Ser: 1.12 mg/dL — ABNORMAL HIGH (ref 0.44–1.00)
GFR, Estimated: 49 mL/min — ABNORMAL LOW (ref >60)
Glucose, Bld: 110 mg/dL — ABNORMAL HIGH (ref 70–99)
Potassium: 3 mmol/L — ABNORMAL LOW (ref 3.5–5.1)
Sodium: 141 mmol/L (ref 135–145)

## 2022-02-01 MED ORDER — POTASSIUM CHLORIDE 2 MEQ/ML IV SOLN
INTRAVENOUS | Status: DC
  Filled 2022-02-01 (×4): qty 1000

## 2022-02-01 MED ORDER — SENNOSIDES-DOCUSATE SODIUM 8.6-50 MG PO TABS
1.0000 | ORAL_TABLET | Freq: Every day | ORAL | Status: DC
  Administered 2022-02-01 – 2022-02-03 (×3): 1 via ORAL
  Filled 2022-02-01 (×3): qty 1

## 2022-02-01 MED ORDER — QUETIAPINE FUMARATE 25 MG PO TABS
25.0000 mg | ORAL_TABLET | Freq: Every day | ORAL | Status: DC
  Administered 2022-02-01 – 2022-02-03 (×3): 25 mg via ORAL
  Filled 2022-02-01 (×3): qty 1

## 2022-02-01 MED ORDER — METOPROLOL TARTRATE 5 MG/5ML IV SOLN
2.5000 mg | Freq: Four times a day (QID) | INTRAVENOUS | Status: DC | PRN
Start: 2022-02-01 — End: 2022-02-04
  Filled 2022-02-01: qty 5

## 2022-02-01 MED ORDER — ACETAMINOPHEN 325 MG PO TABS
650.0000 mg | ORAL_TABLET | Freq: Four times a day (QID) | ORAL | Status: DC | PRN
  Administered 2022-02-04: 650 mg via ORAL
  Filled 2022-02-01: qty 2

## 2022-02-01 MED ORDER — HYDROMORPHONE HCL 1 MG/ML IJ SOLN: 0.5000 mg | INTRAMUSCULAR | Status: DC | PRN

## 2022-02-01 MED ORDER — MUPIROCIN 2 % EX OINT
1.0000 | TOPICAL_OINTMENT | Freq: Two times a day (BID) | CUTANEOUS | Status: DC
  Administered 2022-02-01 – 2022-02-04 (×3): 1 via NASAL
  Filled 2022-02-01: qty 22

## 2022-02-01 MED ORDER — MIRTAZAPINE 15 MG PO TABS
15.0000 mg | ORAL_TABLET | Freq: Every day | ORAL | Status: DC
Start: 2022-02-01 — End: 2022-02-04
  Administered 2022-02-01 – 2022-02-03 (×3): 15 mg via ORAL
  Filled 2022-02-01 (×3): qty 1

## 2022-02-01 MED ORDER — POLYETHYLENE GLYCOL 3350 17 G PO PACK: 17.0000 g | PACK | Freq: Every day | ORAL | Status: DC | PRN

## 2022-02-01 MED ORDER — CEFAZOLIN SODIUM-DEXTROSE 2-4 GM/100ML-% IV SOLN
2.0000 g | INTRAVENOUS | Status: AC
  Administered 2022-02-02: 2 g via INTRAVENOUS
  Filled 2022-02-01: qty 100

## 2022-02-01 MED ORDER — OXYCODONE HCL 5 MG PO TABS
5.0000 mg | ORAL_TABLET | ORAL | Status: DC | PRN
  Administered 2022-02-03: 5 mg via ORAL
  Filled 2022-02-01 (×3): qty 1

## 2022-02-01 MED ORDER — DILTIAZEM HCL ER COATED BEADS 120 MG PO CP24
120.0000 mg | ORAL_CAPSULE | Freq: Every day | ORAL | Status: DC
  Administered 2022-02-01 – 2022-02-04 (×4): 120 mg via ORAL
  Filled 2022-02-01 (×4): qty 1

## 2022-02-01 MED ORDER — METOPROLOL TARTRATE 25 MG PO TABS
12.5000 mg | ORAL_TABLET | Freq: Two times a day (BID) | ORAL | Status: DC
  Administered 2022-02-01 (×2): 12.5 mg via ORAL
  Filled 2022-02-01 (×2): qty 1

## 2022-02-01 NOTE — ED Provider Notes (Signed)
[facility] Provider Note    Event Date/Time   First MD Initiated Contact with Patient 02/01/22 [phone]     (approximate)   History   Fall and Hip Pain   HPI  Cassie Starch, Cassie Lopez is a 81 y.o. female with history of atrial fibrillation on Eliquis, hypertension, breast cancer, and dementia who presents with left hip and leg pain after a fall yesterday.  The patient states she was turning to get close out of the hamper when she lost her footing and fell.  She denies hitting her head, denies neck or back pain.  She states someone helped her back into bed and then she was not able to get up on her own to go to the bathroom yesterday.  She reports pain in the left hip and knee.    Physical Exam   Triage Vital Signs: ED Triage Vitals  Enc Vitals Group     BP 02/01/22 0815 (!) 154/90     Pulse Rate 02/01/22 0815 (!) 51     Resp 02/01/22 0815 20     Temp 02/01/22 0815 98.5 F (36.9 C)     Temp Source 02/01/22 0815 Oral     SpO2 02/01/22 0815 96 %     Weight --      Height --      Head Circumference --      Peak Flow --      Pain Score 02/01/22 0816 10     Pain Loc --      Pain Edu? --      Excl. in [place]? --     Most recent vital signs: Vitals:   02/01/22 1341 02/01/22 1400  BP:  135/90  Pulse:  (!) 110  Resp:  20  Temp: 98.6 F (37 C)   SpO2:  96%     General: Alert and oriented, comfortable appearing. CV:  Good peripheral perfusion.  Resp:  Normal effort.  Abd:  No distention.  Other:  Pain on range of motion of left hip and knee.  No deformity or shortening.  Mild tenderness to the left hip.  Knee extension intact.  2+ DP pulse.   ED Results / Procedures / Treatments   Labs (all labs ordered are listed, but only abnormal results are displayed) Labs Reviewed  BASIC METABOLIC PANEL - Abnormal; Notable for the following components:      Result Value   Potassium 3.0 (*)    Glucose, Bld 110 (*)    Creatinine, Ser 1.12 (*)    Calcium 8.8  (*)    GFR, Estimated 49 (*)    All other components within normal limits  CBC WITH DIFFERENTIAL/PLATELET     EKG  ED ECG REPORT I, Arta Silence, the attending physician, personally viewed and interpreted this ECG.  Date: 02/01/2022 EKG Time: 08 17 Rate: 121 Rhythm: Atrial fibrillation with RVR QRS Axis: normal Intervals: Nonspecific IVCD ST/T Wave abnormalities: normal Narrative Interpretation: no evidence of acute ischemia    RADIOLOGY  XR L hip: I independently viewed and interpreted the images; there is a left femoral neck fracture XR L knee: No acute fracture CT head: No ICH or other acute abnormality CT cervical spine: No acute fracture  PROCEDURES:  Critical Care performed: No  Procedures   MEDICATIONS ORDERED IN ED: Medications  ceFAZolin (ANCEF) IVPB 2g/100 mL premix (has no administration in time range)  diltiazem (CARDIZEM CD) 24 hr capsule 120 mg (120 mg Oral Given 02/01/22  1223)  QUEtiapine (SEROQUEL) tablet 25 mg (has no administration in time range)  acetaminophen (TYLENOL) tablet 650 mg (has no administration in time range)  oxyCODONE (Oxy IR/ROXICODONE) immediate release tablet 5 mg (has no administration in time range)  HYDROmorphone (DILAUDID) injection 0.5 mg (has no administration in time range)  polyethylene glycol (MIRALAX / GLYCOLAX) packet 17 g (has no administration in time range)  senna-docusate (Senokot-S) tablet 1 tablet (has no administration in time range)  lactated ringers 1,000 mL with potassium chloride 40 mEq infusion (has no administration in time range)  metoprolol tartrate (LOPRESSOR) tablet 12.5 mg (12.5 mg Oral Given 02/01/22 1400)  metoprolol tartrate (LOPRESSOR) injection 2.5 mg (has no administration in time range)     IMPRESSION / MDM / ASSESSMENT AND PLAN / ED COURSE  I reviewed the triage vital signs and the nursing notes.   Differential diagnosis includes, but is not limited to, hip or knee fracture, pelvic  fracture, contusion, muscle strain/spasm, radiculopathy.  Patient's presentation is most consistent with acute presentation with potential threat to life or bodily function.  We will obtain x-rays of the left hip and knee.  Although the patient does not believe she hit her head, she has a history of mild dementia and her ability to give history about what happened yesterday is somewhat limited, therefore I will obtain CT head and cervical spine.  The patient is on the cardiac monitor to evaluate for evidence of arrhythmia and/or significant heart rate changes.  ----------------------------------------- 10:38 AM on 02/01/2022 -----------------------------------------  X-rays reveal femoral neck fracture.  CT head and cervical spine are negative for acute findings.  Lab work-up is significant for mild hypokalemia.  There is no leukocytosis.  I consulted Dr. Roland Rack from orthopedics who will evaluate the patient.  I then consulted Dr. Nevada Crane from the hospitalist service; based on our discussion she agrees to admit the patient.  FINAL CLINICAL IMPRESSION(S) / ED DIAGNOSES   Final diagnoses:  Closed fracture of neck of left femur, initial encounter ([place])     Rx / DC Orders   ED Discharge Orders     None        Note:  This document was prepared using Dragon voice recognition software and may include unintentional dictation errors.    Arta Silence, MD 02/01/22 1537

## 2022-02-01 NOTE — ED Triage Notes (Signed)
Patient brought in via ems from [facility]. Patient states about 0430 AM yesterday she had a fall from standing. Was able to get back in bed, but by yesterday evening could not put pressure on left leg. Denies other injury or hitting head. Paperwork reports patient on eliquis

## 2022-02-01 NOTE — ED Notes (Signed)
   02/01/22 1300  Vitals  BP 128/79  MAP (mmHg) 96  Pulse Rate 94  ECG Heart Rate (!) 139  Resp (!) 24  MEWS COLOR  MEWS Score Color Red  Oxygen Therapy  SpO2 96 %  O2 Device Room Air  MEWS Score  MEWS Temp 0  MEWS Systolic 0  MEWS Pulse 3  MEWS RR 1  MEWS LOC 0  MEWS Score 4   HR remains unchanged from dose of diltiazem. MD made aware. New orders placed for metoprolol. Will give.

## 2022-02-01 NOTE — ED Notes (Signed)
   02/01/22 1100  Vitals  BP 138/88  MAP (mmHg) 105  Pulse Rate (!) 145  ECG Heart Rate (!) 119  Resp 19  MEWS COLOR  MEWS Score Color Yellow  Oxygen Therapy  SpO2 96 %  MEWS Score  MEWS Temp 0  MEWS Systolic 0  MEWS Pulse 2  MEWS RR 0  MEWS LOC 0  MEWS Score 2   HR ranging from 100-130's. MD made aware. Orders placed to give home PO dose of diltiazem. Dose given.

## 2022-02-01 NOTE — H&P (Addendum)
History and Physical  Cassie Starch, PhD RWE:[id number] DOB: 01/24/1941 DOA: 02/01/2022  Referring physician: Jani Gravel  PCP: Kirk Ruths, MD  Outpatient Specialists: Medical oncology, cardiology. Patient coming from: Assisted living facility.  Chief Complaint: Fall, mechanical.  HPI: Cassie Starch, PhD is a 81 y.o. female with medical history significant for permanent atrial fibrillation on Eliquis, dementia, breast cancer on letrozole, hypertension, who presented to [facility] from assisted living facility after a mechanical fall at home.  Her fall occurred yesterday, on 01/31/2022 AM.  Since then, the patient has been unable to put any weight on her left lower extremity.  Due to persistent pain in her left hip she presented to the ED today for further evaluation.  Imaging reveals left femoral neck fracture.  Last dose of Eliquis was yesterday evening.  EDP discussed the case with orthopedic surgery who is planning on operating possibly on 02/02/2022.  N.p.o. after midnight.  No need for further washout of Eliquis.  ED Course: Tmax 98.5, BP 128/79, HR 94-139, RR 24.  O2 sat 96%.  Labs today significant for serum potassium 3.0, creatinine 1.12 with GFR 49.  Review of Systems: Review of systems as noted in the HPI. All other systems reviewed and are negative.   Past Medical History:  Diagnosis Date   Atrial fibrillation ([place])    Breast cancer ([place]) 2017   bilateral breast cancer   Hypertension    OFF MEDS X 6 MONTHS DUE TO CONTROLLED BP (08-15-15)   Osteoporosis    Past Surgical History:  Procedure Laterality Date   ANKLE SURGERY Left 2011   BILATERAL TOTAL MASTECTOMY WITH AXILLARY LYMPH NODE DISSECTION Bilateral 08/24/2015   Left T2, N62m ER/PR +; Her 2 neu not overexpressed, Right: DCIS.    BREAST BIOPSY Right 08/08/15   stereo/DUCTAL CARCINOMA IN SITU    BREAST BIOPSY Left 08-15-15   INVASIVE CARCINOMA, dr byrnett   CATARACT EXTRACTION W/PHACO Left 04/10/2017   Procedure:  CATARACT EXTRACTION PHACO AND INTRAOCULAR LENS PLACEMENT (IOC);  Surgeon: BLeandrew Koyanagi MD;  Location: [facility] ORS;  Service: Ophthalmology;  Laterality: Left;  [place]:13.3 AP% 14.8 CDE 10.81 Fluid Pack  lot # [id number]   CATARACT EXTRACTION W/PHACO Right 05/14/2017   Procedure: CATARACT EXTRACTION PHACO AND INTRAOCULAR LENS PLACEMENT ([place]  RIGHT;  Surgeon: BLeandrew Koyanagi MD;  [place] [facility]  Service: Ophthalmology;  Laterality: Right;   FRACTURE SURGERY     left breast excision Left    MASTECTOMY Bilateral 08/24/2015   SENTINEL NODE BIOPSY Bilateral 08/24/2015   Procedure: SENTINEL NODE BIOPSY;  Surgeon: JRobert Bellow MD;  Location: [facility] ORS;  Service: General;  Laterality: Bilateral;   WRIST FRACTURE SURGERY      Social History:  reports that she has never smoked. She has never used smokeless tobacco. She reports that she does not drink alcohol and does not use drugs.   Allergies  Allergen Reactions   Bactericin [Bacitracin] Rash    bactrim    Family History  Problem Relation Age of Onset   Cancer Neg Hx       Prior to Admission medications   Medication Sig Start Date End Date Taking? Authorizing Provider  apixaban (ELIQUIS) 5 MG TABS tablet Take 1 tablet (5 mg total) by mouth 2 (two) times daily. 11/10/19 02/11/20  MLilia Pro, MD  aspirin 81 MG tablet Take 81 mg by mouth daily.     [provider]  B Complex-Biotin-FA (SUPER QUINTS B-50) TABS Take by  mouth.    [provider]  calcium carbonate (CALCIUM 600) 600 MG TABS tablet Take 1,200 mg by mouth daily with breakfast.     [provider]  Cholecalciferol (VITAMIN D3) 1000 units CAPS Take 2,000 Units by mouth daily.     [provider]  diltiazem (CARDIZEM CD) 240 MG 24 hr capsule Take 1 capsule (240 mg total) by mouth daily. 12/04/19   Lavina Hamman, MD  furosemide (LASIX) 20 MG tablet Take 20 mg by mouth daily. 02/15/20   [provider]   letrozole (FEMARA) 2.5 MG tablet Take 1 tablet (2.5 mg total) by mouth daily. 09/22/18   Robert Bellow, MD  mirtazapine (REMERON) 15 MG tablet Take 15 mg by mouth at bedtime. Patient not taking: Reported on 04/12/2020    [provider]  Omega-3 Fatty Acids (FISH OIL) 1000 MG CAPS Take 1,000 mg by mouth daily.     [provider]  QUEtiapine (SEROQUEL) 25 MG tablet Take 25 mg by mouth at bedtime.    [provider]  Selenium 200 MCG CAPS Take 200 mcg by mouth daily.     [provider]    Physical Exam: BP 138/88   Pulse (!) 145   Temp 98.5 F (36.9 C) (Oral)   Resp 19   SpO2 96%   General: 81 y.o. year-old female well developed well nourished in no acute distress.  Alert and interactive. Cardiovascular: Irregular rate and rhythm with no rubs or gallops.  No thyromegaly or JVD noted.  No lower extremity edema. 2/4 pulses in all 4 extremities. Respiratory: Clear to auscultation with no wheezes or rales. Good inspiratory effort. Abdomen: Soft nontender nondistended with normal bowel sounds x4 quadrants. Muskuloskeletal: No cyanosis, clubbing or edema noted bilaterally.  Left lower extremity externally rotated. Neuro: CN II-XII intact, strength, sensation, reflexes Skin: No ulcerative lesions noted or rashes Psychiatry: Judgement and insight appear normal. Mood is appropriate for condition and setting          Labs on Admission:  Basic Metabolic Panel: Recent Labs  Lab 02/01/22 1040  NA 141  K 3.0*  CL 106  CO2 26  GLUCOSE 110*  BUN 21  CREATININE 1.12*  CALCIUM 8.8*   Liver Function Tests: No results for input(s): "AST", "ALT", "ALKPHOS", "BILITOT", "PROT", "ALBUMIN" in the last 168 hours. No results for input(s): "LIPASE", "AMYLASE" in the last 168 hours. No results for input(s): "AMMONIA" in the last 168 hours. CBC: Recent Labs  Lab 02/01/22 1040  WBC 7.4  NEUTROABS 5.9  HGB 13.9  HCT 41.9  MCV 91.1  PLT 199   Cardiac  Enzymes: No results for input(s): "CKTOTAL", "CKMB", "CKMBINDEX", "TROPONINI" in the last 168 hours.  BNP (last 3 results) No results for input(s): "BNP" in the last 8760 hours.  ProBNP (last 3 results) No results for input(s): "PROBNP" in the last 8760 hours.  CBG: No results for input(s): "GLUCAP" in the last 168 hours.  Radiological Exams on Admission: CT Head Wo Contrast  Result Date: 02/01/2022 CLINICAL DATA:  Head trauma, minor (Age >= 65y); Neck trauma (Age >= 65y) EXAM: CT HEAD WITHOUT CONTRAST CT CERVICAL SPINE WITHOUT CONTRAST TECHNIQUE: Multidetector CT imaging of the head and cervical spine was performed following the standard protocol without intravenous contrast. Multiplanar CT image reconstructions of the cervical spine were also generated. RADIATION DOSE REDUCTION: This exam was performed according to the departmental dose-optimization program which includes automated exposure control, adjustment of the mA and/or  kV according to patient size and/or use of iterative reconstruction technique. COMPARISON:  None Available. FINDINGS: CT HEAD FINDINGS Brain: No evidence of acute infarction, hemorrhage, hydrocephalus, extra-axial collection or mass lesion/mass effect. Please note a small portion of the most superficial right cerebral convexity is not imaged due to patient positioning. Vascular: No hyperdense vessel identified. Skull: No evidence of acute fracture. Chronic sclerotic right calvarial lesion, probably benign. Sinuses/Orbits: Largely clear sinuses.  No acute orbital findings. Other: No mastoid effusions. CT CERVICAL SPINE FINDINGS Alignment: No substantial sagittal subluxation. Skull base and vertebrae: Vertebral body heights are maintained. No evidence of acute fracture. Soft tissues and spinal canal: No prevertebral fluid or swelling. No visible canal hematoma. Disc levels: Multilevel degenerative disease, greatest at C4-C5 and C5-C6. Upper chest: Visualized lung apices are  clear. Other: Large (3.4 cm) heterogeneous mass, arising from or adjacent to the left thyroid gland. IMPRESSION: 1. No evidence of acute intracranial abnormality. 2. No evidence of acute fracture or traumatic malalignment in the cervical spine. 3. Large (3.4 cm) heterogeneous mass arising from or adjacent to the left thyroid gland. Recommend thyroid ultrasound (ref: J Am Coll Radiol. 2015 Feb;12(2): 143-50). Electronically Signed   By: Margaretha Sheffield M.D.   On: 02/01/2022 09:47   CT Cervical Spine Wo Contrast  Result Date: 02/01/2022 CLINICAL DATA:  Head trauma, minor (Age >= 65y); Neck trauma (Age >= 65y) EXAM: CT HEAD WITHOUT CONTRAST CT CERVICAL SPINE WITHOUT CONTRAST TECHNIQUE: Multidetector CT imaging of the head and cervical spine was performed following the standard protocol without intravenous contrast. Multiplanar CT image reconstructions of the cervical spine were also generated. RADIATION DOSE REDUCTION: This exam was performed according to the departmental dose-optimization program which includes automated exposure control, adjustment of the mA and/or kV according to patient size and/or use of iterative reconstruction technique. COMPARISON:  None Available. FINDINGS: CT HEAD FINDINGS Brain: No evidence of acute infarction, hemorrhage, hydrocephalus, extra-axial collection or mass lesion/mass effect. Please note a small portion of the most superficial right cerebral convexity is not imaged due to patient positioning. Vascular: No hyperdense vessel identified. Skull: No evidence of acute fracture. Chronic sclerotic right calvarial lesion, probably benign. Sinuses/Orbits: Largely clear sinuses.  No acute orbital findings. Other: No mastoid effusions. CT CERVICAL SPINE FINDINGS Alignment: No substantial sagittal subluxation. Skull base and vertebrae: Vertebral body heights are maintained. No evidence of acute fracture. Soft tissues and spinal canal: No prevertebral fluid or swelling. No visible canal  hematoma. Disc levels: Multilevel degenerative disease, greatest at C4-C5 and C5-C6. Upper chest: Visualized lung apices are clear. Other: Large (3.4 cm) heterogeneous mass, arising from or adjacent to the left thyroid gland. IMPRESSION: 1. No evidence of acute intracranial abnormality. 2. No evidence of acute fracture or traumatic malalignment in the cervical spine. 3. Large (3.4 cm) heterogeneous mass arising from or adjacent to the left thyroid gland. Recommend thyroid ultrasound (ref: J Am Coll Radiol. 2015 Feb;12(2): 143-50). Electronically Signed   By: Margaretha Sheffield M.D.   On: 02/01/2022 09:47   DG Hip Unilat W or Wo Pelvis 2-3 Views Left  Result Date: 02/01/2022 CLINICAL DATA:  Left hip pain after fall EXAM: DG HIP (WITH OR WITHOUT PELVIS) 2-3V LEFT COMPARISON:  None Available. FINDINGS: There is an impacted fracture of the left femoral neck with a cortical break seen along the medial aspect of the head/neck junction. Femoroacetabular alignment is maintained. No other fracture is seen. Right hip alignment is normal. The SI joints and symphysis pubis are intact. IMPRESSION:  Impacted fracture of the left femoral neck. Electronically Signed   By: Valetta Mole M.D.   On: 02/01/2022 09:26   DG Chest 1 View  Result Date: 02/01/2022 CLINICAL DATA:  Fall from standing EXAM: CHEST  1 VIEW COMPARISON:  PET-CT 01/10/2020 FINDINGS: Cardiomediastinal silhouette and pulmonary vasculature are within normal limits. Lungs are clear. IMPRESSION: No acute cardiopulmonary process. Electronically Signed   By: Miachel Roux M.D.   On: 02/01/2022 09:23   DG Knee 2 Views Left  Result Date: 02/01/2022 CLINICAL DATA:  Left hip and knee pain status post fall EXAM: LEFT KNEE - 1-2 VIEW COMPARISON:  None available FINDINGS: No fracture or dislocation. Soft tissues are normal. Mild diffuse osteopenia. IMPRESSION: No acute abnormality of the left knee. Electronically Signed   By: Miachel Roux M.D.   On: 02/01/2022 09:22     EKG: I independently viewed the EKG done and my findings are as followed: IRRR, no specific ST T changes.  QTC 437.  Assessment/Plan Present on Admission:  Fracture of femoral neck, left (HCC)  Principal Problem:   Fracture of femoral neck, left (HCC)  Left femoral neck fracture post mechanical fall X-rays revealed femoral neck fracture. EDP discussed the case with Dr. Roland Rack from orthopedic surgery Possible orthopedic surgical intervention on 02/02/2022 N.p.o. after midnight Analgesics as needed Bowel regimen as needed CBC, INR in the morning  Chronic atrial fibrillation on Eliquis Last dose of Eliquis taken on 01/31/2022 evening. Continue to hold off Eliquis until okayed to restart by orthopedic surgery Rate controlled on p.o. diltiazem and oral Lopressor IV Lopressor as needed with parameters for atrial fibrillation with RVR Closely monitor on telemetry  CKD 3B Appears to be at her baseline creatinine Avoid nephrotoxic agents, dehydration, and hypotension. Gentle IV fluid hydration Monitor urine output with strict I's and O's Repeat renal panel in the morning  Hypokalemia Started LR KCl 40 mEq at 75 cc/h Repeat renal panel  Chronic diastolic CHF Last 2D echo done on 12/03/2019 revealed LVEF 50 to 55% Currently euvolemic on exam Closely monitor volume status while on IV fluid hydration Start strict I's and O's and daily weight  History of breast cancer status post bilateral mastectomy Resume letrozole when okay with orthopedic surgery  Chronic depression/mood disorder Stable Resume home Seroquel and mirtazapine   DVT prophylaxis: SCDs.  Pharmacological DVT prophylaxis contraindicated at this time due to anticipated orthopedic surgery possibly in the morning.  Code Status: DNR.  The patient had a DNR form in the room.  Family Communication: None at bedside.  Disposition Plan: Admitted to telemetry surgical unit.  Consults called: Orthopedic  surgery  Admission status: Inpatient status.   Status is: Inpatient The patient requires at least 2 midnights for further evaluation and treatment of present condition.   Kayleen Memos MD [facility] Pager [phone]  If 7PM-7AM, please contact night-coverage [email or web address] Password [email or web address]  02/01/2022, 1:05 PM

## 2022-02-02 ENCOUNTER — Other Ambulatory Visit: Payer: Self-pay

## 2022-02-02 ENCOUNTER — Inpatient Hospital Stay: Payer: Medicare Other

## 2022-02-02 ENCOUNTER — Inpatient Hospital Stay: Payer: Medicare Other | Admitting: Anesthesiology

## 2022-02-02 ENCOUNTER — Encounter
Admission: EM | Disposition: A | Discharge status: Skilled Nursing Facility | Payer: Self-pay | Source: Skilled Nursing Facility | Attending: Obstetrics and Gynecology

## 2022-02-02 DIAGNOSIS — S72002A Fracture of unspecified part of neck of left femur, initial encounter for closed fracture: Secondary | ICD-10-CM | POA: Diagnosis not present

## 2022-02-02 HISTORY — PX: HIP PINNING,CANNULATED: SHX1758

## 2022-02-02 LAB — CBC
HCT: 41.2 % (ref 36.0–46.0)
Hemoglobin: 13.7 g/dL (ref 12.0–15.0)
MCH: 29.9 pg (ref 26.0–34.0)
MCHC: 33.3 g/dL (ref 30.0–36.0)
MCV: 90 fL (ref 80.0–100.0)
Platelets: 202 10*3/uL (ref 150–400)
RBC: 4.58 MIL/uL (ref 3.87–5.11)
RDW: 13.9 % (ref 11.5–15.5)
WBC: 6.3 10*3/uL (ref 4.0–10.5)
nRBC: 0 % (ref 0.0–0.2)

## 2022-02-02 LAB — BASIC METABOLIC PANEL
Anion gap: 11 (ref 5–15)
BUN: 29 mg/dL — ABNORMAL HIGH (ref 8–23)
CO2: 21 mmol/L — ABNORMAL LOW (ref 22–32)
Calcium: 8.7 mg/dL — ABNORMAL LOW (ref 8.9–10.3)
Chloride: 108 mmol/L (ref 98–111)
Creatinine, Ser: 1.12 mg/dL — ABNORMAL HIGH (ref 0.44–1.00)
GFR, Estimated: 49 mL/min — ABNORMAL LOW (ref >60)
Glucose, Bld: 100 mg/dL — ABNORMAL HIGH (ref 70–99)
Potassium: 3.6 mmol/L (ref 3.5–5.1)
Sodium: 140 mmol/L (ref 135–145)

## 2022-02-02 LAB — PHOSPHORUS: Phosphorus: 3.5 mg/dL (ref 2.5–4.6)

## 2022-02-02 LAB — PROTIME-INR
INR: 1.3 — ABNORMAL HIGH (ref 0.8–1.2)
Prothrombin Time: 16.2 seconds — ABNORMAL HIGH (ref 11.4–15.2)

## 2022-02-02 LAB — MAGNESIUM: Magnesium: 2 mg/dL (ref 1.7–2.4)

## 2022-02-02 SURGERY — FIXATION, FEMUR, NECK, PERCUTANEOUS, USING SCREW
Anesthesia: General | Site: Hip | Laterality: Left

## 2022-02-02 MED ORDER — FLEET ENEMA 7-19 GM/118ML RE ENEM: 1.0000 | ENEMA | Freq: Once | RECTAL | Status: DC | PRN

## 2022-02-02 MED ORDER — DEXAMETHASONE SODIUM PHOSPHATE 10 MG/ML IJ SOLN
INTRAMUSCULAR | Status: AC
  Filled 2022-02-02: qty 3

## 2022-02-02 MED ORDER — KETAMINE HCL 10 MG/ML IJ SOLN
INTRAMUSCULAR | Status: DC | PRN
  Administered 2022-02-02 (×2): 15 mg via INTRAVENOUS

## 2022-02-02 MED ORDER — SODIUM CHLORIDE 0.9 % IV SOLN: INTRAVENOUS | Status: DC | PRN

## 2022-02-02 MED ORDER — POLYETHYLENE GLYCOL 3350 17 G PO PACK
17.0000 g | PACK | Freq: Two times a day (BID) | ORAL | Status: DC
  Administered 2022-02-03 (×2): 17 g via ORAL
  Filled 2022-02-02 (×5): qty 1

## 2022-02-02 MED ORDER — ONDANSETRON HCL 4 MG/2ML IJ SOLN: 4.0000 mg | Freq: Four times a day (QID) | INTRAMUSCULAR | Status: DC | PRN

## 2022-02-02 MED ORDER — METOCLOPRAMIDE HCL 5 MG PO TABS
5.0000 mg | ORAL_TABLET | Freq: Three times a day (TID) | ORAL | Status: DC | PRN
  Administered 2022-02-03: 10 mg via ORAL
  Filled 2022-02-02: qty 2

## 2022-02-02 MED ORDER — VITAMIN D 25 MCG (1000 UNIT) PO TABS
2000.0000 [IU] | ORAL_TABLET | Freq: Every day | ORAL | Status: DC
Start: 2022-02-02 — End: 2022-02-04
  Administered 2022-02-02 – 2022-02-04 (×3): 2000 [IU] via ORAL
  Filled 2022-02-02 (×4): qty 2

## 2022-02-02 MED ORDER — ONDANSETRON HCL 4 MG/2ML IJ SOLN
INTRAMUSCULAR | Status: AC
  Filled 2022-02-02: qty 6

## 2022-02-02 MED ORDER — DEXAMETHASONE SODIUM PHOSPHATE 10 MG/ML IJ SOLN
INTRAMUSCULAR | Status: DC | PRN
  Administered 2022-02-02: 4 mg via INTRAVENOUS

## 2022-02-02 MED ORDER — PHENYLEPHRINE 80 MCG/ML (10ML) SYRINGE FOR IV PUSH (FOR BLOOD PRESSURE SUPPORT)
PREFILLED_SYRINGE | INTRAVENOUS | Status: AC
  Filled 2022-02-02: qty 40

## 2022-02-02 MED ORDER — METOCLOPRAMIDE HCL 5 MG/ML IJ SOLN: 5.0000 mg | Freq: Three times a day (TID) | INTRAMUSCULAR | Status: DC | PRN

## 2022-02-02 MED ORDER — ACETAMINOPHEN 10 MG/ML IV SOLN: 1000.0000 mg | Freq: Once | INTRAVENOUS | Status: DC | PRN

## 2022-02-02 MED ORDER — KETAMINE HCL 50 MG/5ML IJ SOSY
PREFILLED_SYRINGE | INTRAMUSCULAR | Status: AC
  Filled 2022-02-02: qty 5

## 2022-02-02 MED ORDER — ENOXAPARIN SODIUM 40 MG/0.4ML IJ SOSY: 40.0000 mg | PREFILLED_SYRINGE | INTRAMUSCULAR | Status: DC

## 2022-02-02 MED ORDER — KETOROLAC TROMETHAMINE 30 MG/ML IJ SOLN
INTRAMUSCULAR | Status: DC | PRN
  Administered 2022-02-02: 10 mg via INTRAVENOUS

## 2022-02-02 MED ORDER — LETROZOLE 2.5 MG PO TABS
2.5000 mg | ORAL_TABLET | Freq: Every day | ORAL | Status: DC
  Administered 2022-02-03 – 2022-02-04 (×2): 2.5 mg via ORAL
  Filled 2022-02-02 (×2): qty 1

## 2022-02-02 MED ORDER — OXYCODONE HCL 5 MG/5ML PO SOLN: 5.0000 mg | Freq: Once | ORAL | Status: DC | PRN

## 2022-02-02 MED ORDER — FENTANYL CITRATE (PF) 100 MCG/2ML IJ SOLN: 25.0000 ug | INTRAMUSCULAR | Status: DC | PRN

## 2022-02-02 MED ORDER — CEFAZOLIN SODIUM-DEXTROSE 2-4 GM/100ML-% IV SOLN
2.0000 g | Freq: Four times a day (QID) | INTRAVENOUS | Status: AC
  Administered 2022-02-02 – 2022-02-03 (×3): 2 g via INTRAVENOUS
  Filled 2022-02-02 (×4): qty 100

## 2022-02-02 MED ORDER — ACETAMINOPHEN 10 MG/ML IV SOLN
INTRAVENOUS | Status: AC
  Filled 2022-02-02: qty 100

## 2022-02-02 MED ORDER — APIXABAN 5 MG PO TABS
5.0000 mg | ORAL_TABLET | Freq: Two times a day (BID) | ORAL | Status: DC
  Administered 2022-02-03 – 2022-02-04 (×3): 5 mg via ORAL
  Filled 2022-02-02 (×4): qty 1

## 2022-02-02 MED ORDER — PHENYLEPHRINE HCL (PRESSORS) 10 MG/ML IV SOLN
INTRAVENOUS | Status: DC | PRN
  Administered 2022-02-02: 80 ug via INTRAVENOUS
  Administered 2022-02-02: 40 ug via INTRAVENOUS

## 2022-02-02 MED ORDER — PROPOFOL 10 MG/ML IV BOLUS
INTRAVENOUS | Status: AC
  Filled 2022-02-02: qty 20

## 2022-02-02 MED ORDER — 0.9 % SODIUM CHLORIDE (POUR BTL) OPTIME
TOPICAL | Status: DC | PRN
  Administered 2022-02-02: 500 mL

## 2022-02-02 MED ORDER — FUROSEMIDE 20 MG PO TABS: 20.0000 mg | ORAL_TABLET | Freq: Every day | ORAL | Status: DC

## 2022-02-02 MED ORDER — LACTATED RINGERS IV SOLN: INTRAVENOUS | Status: DC | PRN

## 2022-02-02 MED ORDER — MAGNESIUM HYDROXIDE 400 MG/5ML PO SUSP: 30.0000 mL | Freq: Every day | ORAL | Status: DC | PRN

## 2022-02-02 MED ORDER — LIDOCAINE HCL (PF) 2 % IJ SOLN
INTRAMUSCULAR | Status: AC
  Filled 2022-02-02: qty 5

## 2022-02-02 MED ORDER — BUPIVACAINE-EPINEPHRINE (PF) 0.5% -1:200000 IJ SOLN
INTRAMUSCULAR | Status: DC | PRN
  Administered 2022-02-02: 20 mL via PERINEURAL

## 2022-02-02 MED ORDER — PROPOFOL 10 MG/ML IV BOLUS
INTRAVENOUS | Status: DC | PRN
  Administered 2022-02-02: 70 mg via INTRAVENOUS

## 2022-02-02 MED ORDER — DOCUSATE SODIUM 100 MG PO CAPS
100.0000 mg | ORAL_CAPSULE | Freq: Two times a day (BID) | ORAL | Status: DC
  Administered 2022-02-02 – 2022-02-04 (×5): 100 mg via ORAL
  Filled 2022-02-02 (×6): qty 1

## 2022-02-02 MED ORDER — ACETAMINOPHEN 500 MG PO TABS
500.0000 mg | ORAL_TABLET | Freq: Four times a day (QID) | ORAL | Status: AC
  Administered 2022-02-02 – 2022-02-03 (×4): 500 mg via ORAL
  Filled 2022-02-02 (×5): qty 1

## 2022-02-02 MED ORDER — BISACODYL 10 MG RE SUPP: 10.0000 mg | Freq: Every day | RECTAL | Status: DC | PRN

## 2022-02-02 MED ORDER — ONDANSETRON HCL 4 MG/2ML IJ SOLN: 4.0000 mg | Freq: Once | INTRAMUSCULAR | Status: DC | PRN

## 2022-02-02 MED ORDER — ONDANSETRON HCL 4 MG PO TABS: 4.0000 mg | ORAL_TABLET | Freq: Four times a day (QID) | ORAL | Status: DC | PRN

## 2022-02-02 MED ORDER — OXYCODONE HCL 5 MG PO TABS: 5.0000 mg | ORAL_TABLET | Freq: Once | ORAL | Status: DC | PRN

## 2022-02-02 MED ORDER — ACETAMINOPHEN 10 MG/ML IV SOLN
INTRAVENOUS | Status: DC | PRN
  Administered 2022-02-02: 1000 mg via INTRAVENOUS

## 2022-02-02 MED ORDER — SENNA 8.6 MG PO TABS: 1.0000 | ORAL_TABLET | Freq: Every day | ORAL | Status: DC

## 2022-02-02 MED ORDER — FENTANYL CITRATE (PF) 100 MCG/2ML IJ SOLN
INTRAMUSCULAR | Status: AC
  Filled 2022-02-02: qty 2

## 2022-02-02 MED ORDER — VITAMIN D3 25 MCG (1000 UT) PO CAPS
2000.0000 [IU] | ORAL_CAPSULE | Freq: Every day | ORAL | Status: DC
  Filled 2022-02-02: qty 2

## 2022-02-02 MED ORDER — LIDOCAINE HCL (CARDIAC) PF 100 MG/5ML IV SOSY
PREFILLED_SYRINGE | INTRAVENOUS | Status: DC | PRN
  Administered 2022-02-02: 80 mg via INTRAVENOUS

## 2022-02-02 MED ORDER — DIPHENHYDRAMINE HCL 12.5 MG/5ML PO ELIX: 12.5000 mg | ORAL_SOLUTION | ORAL | Status: DC | PRN

## 2022-02-02 MED ORDER — KETOROLAC TROMETHAMINE 30 MG/ML IJ SOLN
INTRAMUSCULAR | Status: AC
  Filled 2022-02-02: qty 3

## 2022-02-02 MED ORDER — ONDANSETRON HCL 4 MG/2ML IJ SOLN
INTRAMUSCULAR | Status: DC | PRN
  Administered 2022-02-02: 4 mg via INTRAVENOUS

## 2022-02-02 MED ORDER — FENTANYL CITRATE (PF) 100 MCG/2ML IJ SOLN
INTRAMUSCULAR | Status: DC | PRN
  Administered 2022-02-02: 25 ug via INTRAVENOUS
  Administered 2022-02-02: 40 ug via INTRAVENOUS

## 2022-02-02 SURGICAL SUPPLY — 33 items
BIT DRILL CANNULATED 5.0 (BIT) ×1 IMPLANT
BNDG COHESIVE 4X5 TAN ST LF (GAUZE/BANDAGES/DRESSINGS) ×2 IMPLANT
BNDG COHESIVE 6X5 TAN ST LF (GAUZE/BANDAGES/DRESSINGS) ×2 IMPLANT
CHLORAPREP W/TINT 26 (MISCELLANEOUS) ×4 IMPLANT
DRSG OPSITE POSTOP 3X4 (GAUZE/BANDAGES/DRESSINGS) ×2 IMPLANT
DRSG OPSITE POSTOP 4X6 (GAUZE/BANDAGES/DRESSINGS) ×1 IMPLANT
ELECT REM PT RETURN 9FT ADLT (ELECTROSURGICAL) ×2
ELECTRODE REM PT RTRN 9FT ADLT (ELECTROSURGICAL) ×1 IMPLANT
GLOVE BIO SURGEON STRL SZ8 (GLOVE) ×4 IMPLANT
GLOVE SURG UNDER LTX SZ8 (GLOVE) ×2 IMPLANT
GOWN STRL REUS W/ TWL LRG LVL3 (GOWN DISPOSABLE) ×1 IMPLANT
GOWN STRL REUS W/ TWL XL LVL3 (GOWN DISPOSABLE) ×1 IMPLANT
GOWN STRL REUS W/TWL LRG LVL3 (GOWN DISPOSABLE) ×1
GOWN STRL REUS W/TWL XL LVL3 (GOWN DISPOSABLE) ×1
MANIFOLD NEPTUNE II (INSTRUMENTS) ×2 IMPLANT
NDL FILTER BLUNT 18X1 1/2 (NEEDLE) ×1 IMPLANT
NEEDLE FILTER BLUNT 18X 1/2SAF (NEEDLE) ×1
NEEDLE FILTER BLUNT 18X1 1/2 (NEEDLE) ×1 IMPLANT
NEEDLE HYPO 22GX1.5 SAFETY (NEEDLE) ×2 IMPLANT
PACK HIP COMPR (MISCELLANEOUS) ×2 IMPLANT
PIN THD TIP GUIDE 3.2X12 (PIN) ×1 IMPLANT
SCREW CANCELLOUS 7.0X75MM (Screw) ×1 IMPLANT
SCREW CANNULATED 7.0X80 (Screw) ×2 IMPLANT
STAPLER SKIN PROX 35W (STAPLE) ×2 IMPLANT
STRAP SAFETY 5IN WIDE (MISCELLANEOUS) ×2 IMPLANT
SUT PROLENE 2 0 FS (SUTURE) IMPLANT
SUT VIC AB 0 CT1 36 (SUTURE) ×2 IMPLANT
SUT VIC AB 0 SH 27 (SUTURE) IMPLANT
SUT VIC AB 2-0 CT1 27 (SUTURE) ×1
SUT VIC AB 2-0 CT1 TAPERPNT 27 (SUTURE) ×1 IMPLANT
SYR 20ML LL LF (SYRINGE) ×2 IMPLANT
SYR 5ML LL (SYRINGE) ×2 IMPLANT
WATER STERILE IRR 500ML POUR (IV SOLUTION) ×2 IMPLANT

## 2022-02-02 NOTE — Op Note (Signed)
02/02/2022  8:39 AM  Patient:   Cassie Starch, PhD  Pre-Op Diagnosis:   Valgus-impacted left femoral neck fracture.  Post-Op Diagnosis:   Same.  Procedure:   In situ cannulated screw fixation of valgus-impacted left femoral neck fracture.  Surgeon:   Pascal Lux, MD  Assistant:   None  Anesthesia:   General LMA  Findings:   As above.  Complications:   None  EBL:   5 cc  Fluids:   600 cc crystalloid  UOP:   None  TT:   None  Drains:   None  Closure:   Staples  Implants:   Biomet 6.5 mm cannulated screws (16 mm) 3  Brief Clinical Note:   The patient is a 81 year old female who sustained above-noted injury 3 days ago when she apparently lost her balance and fell while pulling the lint screen out of her dryer. Initially, she did not seek treatment, but because of continued pain with ambulation, she presented to the emergency room yesterday afternoon where x-rays demonstrated the above-noted injury. The patient presented to the emergency room where x-rays demonstrated the above-noted findings. The patient has been cleared medically and presents at this time for definitive management of this injury.  Procedure:   The patient was brought into the operating room. After adequate spinal anesthesia was obtained, the patient was lain in the supine position on the fracture table. The uninjured leg was placed in a flexed and abducted position over the well-leg holder while the operative leg was placed in gentle longitudinal traction with some internal rotation. The adequacy of fracture position was verified fluoroscopically in AP and lateral projections before the lateral aspect of the left hip and thigh were prepped with ChloraPrep solution and draped sterilely. Preoperative antibiotics were administered. A timeout was performed to verify the appropriate surgical site.   An approximately 3-4 cm incision was made over the lateral aspect of the lower part of the greater trochanter as  verified fluoroscopically. This incision was carried down through the subcutaneous cutaneous tissues to expose the iliotibial band. This was split the length the incision to expose the lateral aspect of the proximal femur at the level of the inferior most part of the greater trochanter. Under fluoroscopic guidance, a guide wire was drilled up through the femoral neck along the calcar into the femoral head to rest within 5 mm of subchondral bone. Its position was assessed fluoroscopically in AP and lateral projections and found to be excellent. Two additional guide wires were placed in parallel fashion more proximally, anterior and posterior to the original pin in an inverted triangular configuration. Again the position of these pins was verified fluoroscopically in AP, lateral, and oblique projections and found to be excellent. The length of each of these pins was measured before each pin was overdrilled with the appropriate cannulated drill. Each of these screws was inserted and advanced to within 8-10 mm of subchondral bone. Again the position of each of these screws was assessed fluoroscopically in AP, lateral, and oblique projections, and found to be excellent.  The wound was copiously irrigated with sterile saline solution before the IT band was reapproximated using #0 Vicryl interrupted sutures. The subcutaneous tissues also were closed using 2-0 Vicryl interrupted sutures before the skin was closed using staples. A total of 20 cc of 0.5% Sensorcaine with epinephrine was injected in and around the surgical incision before a sterile occlusive dressing was applied to the wound. The patient was awakened, extubated, and returned to  the recovery room in satisfactory condition after tolerating the procedure well.

## 2022-02-02 NOTE — Anesthesia Postprocedure Evaluation (Signed)
Anesthesia Post Note  Patient: Cassie Starch, PhD  Procedure(s) Performed: CANNULATED HIP PINNIN-Valgus (Left: Hip)  Patient location during evaluation: PACU Anesthesia Type: General Level of consciousness: awake and alert Pain management: pain level controlled Vital Signs Assessment: post-procedure vital signs reviewed and stable Respiratory status: spontaneous breathing, nonlabored ventilation, respiratory function stable and patient connected to nasal cannula oxygen Cardiovascular status: blood pressure returned to baseline and stable Postop Assessment: no apparent nausea or vomiting Anesthetic complications: no   No notable events documented.   Last Vitals:  Vitals:   02/02/22 0900 02/02/22 0915  BP: 124/77 131/75  Pulse: (!) 108 (!) 57  Resp: 17 15  Temp:    SpO2: 95% 95%    Last Pain:  Vitals:   02/02/22 0915  TempSrc:   PainSc: 0-No pain                 Arita Miss

## 2022-02-02 NOTE — Progress Notes (Addendum)
PROGRESS NOTE    Cassie Starch, PhD  BHA:[id number] DOB: [birth year] DOA: 02/01/2022 PCP: Kirk Ruths, MD     Brief Narrative:   From admission h and p Cassie Starch, PhD is a 81 y.o. female with medical history significant for permanent atrial fibrillation on Eliquis, dementia, breast cancer on letrozole, hypertension, who presented to [facility] from assisted living facility after a mechanical fall at home.  Her fall occurred yesterday, on 01/31/2022 AM.  Since then, the patient has been unable to put any weight on her left lower extremity.  Due to persistent pain in her left hip she presented to the ED today for further evaluation.  Imaging reveals left femoral neck fracture.  Last dose of Eliquis was yesterday evening.     Assessment & Plan:   Principal Problem:   Fracture of femoral neck, left (HCC) Active Problems:   Atrial fibrillation with RVR (HCC)   Essential hypertension   History of breast cancer  # Left femoral neck fracture S/p operative repair today w/ dr. Roland Rack. - resume eliquis tomorrow - pain control, bowel regimen - pt/ot consults  # Hx breast cancer - cont home letrozole  # Ckd 3a Kidney function at baseline - monitor  # Chronic a fib Rate controlled - cont home dilt, adjust as needed - apixaban starting tomorrow  # Dementia Lives in assisted living, [place]  # MDD - cont home mirtazapine, quetiapine  # Hypokalemia - monitor and replete as needed  # HFpEF Euvolemic, no longer on lasix - monitor    DVT prophylaxis: SCDs Code Status: DNR Family Communication: sister Opal Sidles updated telephonically 7/22  Level of care: Telemetry Surgical Status is: Inpatient Remains inpatient appropriate because: severity of illness    Consultants:  orthopedics  Procedures: 7/22: In situ cannulated screw fixation of valgus-impacted left femoral neck fracture.  Antimicrobials:  Surgical ppx   Subjective: No pain, tolerated  procedure  Objective: Vitals:   02/02/22 0533 02/02/22 0845 02/02/22 0900 02/02/22 0915  BP: 121/82 131/81 124/77 131/75  Pulse: 78 80 (!) 108 (!) 57  Resp: '20 13 17 15  '$ Temp: 98 F (36.7 C) (!) 97.1 F (36.2 C)    TempSrc:      SpO2: 96% 95% 95% 95%  Weight:        Intake/Output Summary (Last 24 hours) at 02/02/2022 1117 Last data filed at 02/02/2022 0829 Gross per 24 hour  Intake 853.98 ml  Output 305 ml  Net 548.98 ml   Filed Weights   02/02/22 0500  Weight: 77 kg    Examination:  General exam: Appears calm and comfortable  Respiratory system: Clear to auscultation. Respiratory effort normal. Cardiovascular system: S1 & S2 heard, irreg, mild tachy, soft systolic murmur Gastrointestinal system: Abdomen is nondistended, soft and nontender. No organomegaly or masses felt. Normal bowel sounds heard. Central nervous system: Alert and oriented. No focal neurological deficits. Extremities: Symmetric 5 x 5 power. Skin: left lateral hip dressing c/d/i Psychiatry: slightly confused, calm    Data Reviewed: I have personally reviewed following labs and imaging studies  CBC: Recent Labs  Lab 02/01/22 1040 02/02/22 0509  WBC 7.4 6.3  NEUTROABS 5.9  --   HGB 13.9 13.7  HCT 41.9 41.2  MCV 91.1 90.0  PLT 199 973   Basic Metabolic Panel: Recent Labs  Lab 02/01/22 1040 02/02/22 0509  NA 141 140  K 3.0* 3.6  CL 106 108  CO2 26 21*  GLUCOSE 110* 100*  BUN 21  29*  CREATININE 1.12* 1.12*  CALCIUM 8.8* 8.7*  MG  --  2.0  PHOS  --  3.5   GFR: CrCl cannot be calculated (Unknown ideal weight.). Liver Function Tests: No results for input(s): "AST", "ALT", "ALKPHOS", "BILITOT", "PROT", "ALBUMIN" in the last 168 hours. No results for input(s): "LIPASE", "AMYLASE" in the last 168 hours. No results for input(s): "AMMONIA" in the last 168 hours. Coagulation Profile: Recent Labs  Lab 02/02/22 0509  INR 1.3*   Cardiac Enzymes: No results for input(s): "CKTOTAL",  "CKMB", "CKMBINDEX", "TROPONINI" in the last 168 hours. BNP (last 3 results) No results for input(s): "PROBNP" in the last 8760 hours. HbA1C: No results for input(s): "HGBA1C" in the last 72 hours. CBG: No results for input(s): "GLUCAP" in the last 168 hours. Lipid Profile: No results for input(s): "CHOL", "HDL", "LDLCALC", "TRIG", "CHOLHDL", "LDLDIRECT" in the last 72 hours. Thyroid Function Tests: No results for input(s): "TSH", "T4TOTAL", "FREET4", "T3FREE", "THYROIDAB" in the last 72 hours. Anemia Panel: No results for input(s): "VITAMINB12", "FOLATE", "FERRITIN", "TIBC", "IRON", "RETICCTPCT" in the last 72 hours. Urine analysis:    Component Value Date/Time   COLORURINE AMBER (A) 12/01/2019 1925   APPEARANCEUR CLOUDY (A) 12/01/2019 1925   LABSPEC 1.020 12/01/2019 1925   PHURINE 5.0 12/01/2019 1925   GLUCOSEU NEGATIVE 12/01/2019 1925   HGBUR NEGATIVE 12/01/2019 1925   BILIRUBINUR NEGATIVE 12/01/2019 1925   KETONESUR 80 (A) 12/01/2019 1925   PROTEINUR 100 (A) 12/01/2019 1925   NITRITE NEGATIVE 12/01/2019 1925   LEUKOCYTESUR NEGATIVE 12/01/2019 1925   Sepsis Labs: '@LABRCNTIP'$ (procalcitonin:4,lacticidven:4)  ) Recent Results (from the past 240 hour(s))  Surgical PCR screen     Status: None   Collection Time: 02/01/22  6:20 PM   Specimen: Nasal Mucosa; Nasal Swab  Result Value Ref Range Status   MRSA, PCR NEGATIVE NEGATIVE Final   Staphylococcus aureus NEGATIVE NEGATIVE Final    Comment: (NOTE) The Xpert SA Assay (FDA approved for NASAL specimens in patients 46 years of age and older), is one component of a comprehensive surveillance program. It is not intended to diagnose infection nor to guide or monitor treatment. Performed at [facility], [address], [place], [place] [address]          Radiology Studies: DG HIP UNILAT WITH PELVIS 2-3 VIEWS LEFT  Result Date: 02/02/2022 CLINICAL DATA:  Left hip pinning. EXAM: OPERATIVE left HIP (WITH PELVIS IF  PERFORMED) 11 VIEWS TECHNIQUE: Fluoroscopic spot image(s) were submitted for interpretation post-operatively. Radiation Exposure Index (as provided by the fluoroscopic device): Dose area product 1.36 Gy*cm2 COMPARISON:  Left hip radiographs 02/01/2022 FINDINGS: Left femoral neck fracture again noted. Intraoperative images demonstrate 3 screws across the fracture. IMPRESSION: ORIF of the left femoral neck fracture. Electronically Signed   By: San Morelle M.D.   On: 02/02/2022 08:52   DG C-Arm 1-60 Min-No Report  Result Date: 02/02/2022 Fluoroscopy was utilized by the requesting physician.  No radiographic interpretation.   CT Head Wo Contrast  Result Date: 02/01/2022 CLINICAL DATA:  Head trauma, minor (Age >= 65y); Neck trauma (Age >= 65y) EXAM: CT HEAD WITHOUT CONTRAST CT CERVICAL SPINE WITHOUT CONTRAST TECHNIQUE: Multidetector CT imaging of the head and cervical spine was performed following the standard protocol without intravenous contrast. Multiplanar CT image reconstructions of the cervical spine were also generated. RADIATION DOSE REDUCTION: This exam was performed according to the departmental dose-optimization program which includes automated exposure control, adjustment of the mA and/or kV according to patient size and/or use  of iterative reconstruction technique. COMPARISON:  None Available. FINDINGS: CT HEAD FINDINGS Brain: No evidence of acute infarction, hemorrhage, hydrocephalus, extra-axial collection or mass lesion/mass effect. Please note a small portion of the most superficial right cerebral convexity is not imaged due to patient positioning. Vascular: No hyperdense vessel identified. Skull: No evidence of acute fracture. Chronic sclerotic right calvarial lesion, probably benign. Sinuses/Orbits: Largely clear sinuses.  No acute orbital findings. Other: No mastoid effusions. CT CERVICAL SPINE FINDINGS Alignment: No substantial sagittal subluxation. Skull base and vertebrae:  Vertebral body heights are maintained. No evidence of acute fracture. Soft tissues and spinal canal: No prevertebral fluid or swelling. No visible canal hematoma. Disc levels: Multilevel degenerative disease, greatest at C4-C5 and C5-C6. Upper chest: Visualized lung apices are clear. Other: Large (3.4 cm) heterogeneous mass, arising from or adjacent to the left thyroid gland. IMPRESSION: 1. No evidence of acute intracranial abnormality. 2. No evidence of acute fracture or traumatic malalignment in the cervical spine. 3. Large (3.4 cm) heterogeneous mass arising from or adjacent to the left thyroid gland. Recommend thyroid ultrasound (ref: J Am Coll Radiol. 2015 Feb;12(2): 143-50). Electronically Signed   By: Margaretha Sheffield M.D.   On: 02/01/2022 09:47   CT Cervical Spine Wo Contrast  Result Date: 02/01/2022 CLINICAL DATA:  Head trauma, minor (Age >= 65y); Neck trauma (Age >= 65y) EXAM: CT HEAD WITHOUT CONTRAST CT CERVICAL SPINE WITHOUT CONTRAST TECHNIQUE: Multidetector CT imaging of the head and cervical spine was performed following the standard protocol without intravenous contrast. Multiplanar CT image reconstructions of the cervical spine were also generated. RADIATION DOSE REDUCTION: This exam was performed according to the departmental dose-optimization program which includes automated exposure control, adjustment of the mA and/or kV according to patient size and/or use of iterative reconstruction technique. COMPARISON:  None Available. FINDINGS: CT HEAD FINDINGS Brain: No evidence of acute infarction, hemorrhage, hydrocephalus, extra-axial collection or mass lesion/mass effect. Please note a small portion of the most superficial right cerebral convexity is not imaged due to patient positioning. Vascular: No hyperdense vessel identified. Skull: No evidence of acute fracture. Chronic sclerotic right calvarial lesion, probably benign. Sinuses/Orbits: Largely clear sinuses.  No acute orbital findings.  Other: No mastoid effusions. CT CERVICAL SPINE FINDINGS Alignment: No substantial sagittal subluxation. Skull base and vertebrae: Vertebral body heights are maintained. No evidence of acute fracture. Soft tissues and spinal canal: No prevertebral fluid or swelling. No visible canal hematoma. Disc levels: Multilevel degenerative disease, greatest at C4-C5 and C5-C6. Upper chest: Visualized lung apices are clear. Other: Large (3.4 cm) heterogeneous mass, arising from or adjacent to the left thyroid gland. IMPRESSION: 1. No evidence of acute intracranial abnormality. 2. No evidence of acute fracture or traumatic malalignment in the cervical spine. 3. Large (3.4 cm) heterogeneous mass arising from or adjacent to the left thyroid gland. Recommend thyroid ultrasound (ref: J Am Coll Radiol. 2015 Feb;12(2): 143-50). Electronically Signed   By: Margaretha Sheffield M.D.   On: 02/01/2022 09:47   DG Hip Unilat W or Wo Pelvis 2-3 Views Left  Result Date: 02/01/2022 CLINICAL DATA:  Left hip pain after fall EXAM: DG HIP (WITH OR WITHOUT PELVIS) 2-3V LEFT COMPARISON:  None Available. FINDINGS: There is an impacted fracture of the left femoral neck with a cortical break seen along the medial aspect of the head/neck junction. Femoroacetabular alignment is maintained. No other fracture is seen. Right hip alignment is normal. The SI joints and symphysis pubis are intact. IMPRESSION: Impacted fracture of the left femoral neck.  Electronically Signed   By: Valetta Mole M.D.   On: 02/01/2022 09:26   DG Chest 1 View  Result Date: 02/01/2022 CLINICAL DATA:  Fall from standing EXAM: CHEST  1 VIEW COMPARISON:  PET-CT 01/10/2020 FINDINGS: Cardiomediastinal silhouette and pulmonary vasculature are within normal limits. Lungs are clear. IMPRESSION: No acute cardiopulmonary process. Electronically Signed   By: Miachel Roux M.D.   On: 02/01/2022 09:23   DG Knee 2 Views Left  Result Date: 02/01/2022 CLINICAL DATA:  Left hip and knee pain  status post fall EXAM: LEFT KNEE - 1-2 VIEW COMPARISON:  None available FINDINGS: No fracture or dislocation. Soft tissues are normal. Mild diffuse osteopenia. IMPRESSION: No acute abnormality of the left knee. Electronically Signed   By: Miachel Roux M.D.   On: 02/01/2022 09:22        Scheduled Meds:  [START ON 02/03/2022] apixaban  5 mg Oral BID   diltiazem  120 mg Oral Daily   metoprolol tartrate  12.5 mg Oral BID   mirtazapine  15 mg Oral QHS   mupirocin ointment  1 Application Nasal BID   QUEtiapine  25 mg Oral QHS   senna-docusate  1 tablet Oral QHS   Continuous Infusions:  lactated ringers 1,000 mL with potassium chloride 40 mEq infusion 75 mL/hr at 02/02/22 0327     LOS: 1 day     Desma Maxim, MD [facility]   If 7PM-7AM, please contact night-coverage [email or web address] Password [facility] 02/02/2022, 11:17 AM

## 2022-02-02 NOTE — Anesthesia Preprocedure Evaluation (Addendum)
Anesthesia Evaluation  Patient identified by MRN, date of birth, ID band Patient awake  General Assessment Comment:Patient AOx3. Appears to have capacity to make informed consent for this procedure. Eliquis last taken <72 hours ago. NPO appropriate  Reviewed: Allergy & Precautions, NPO status , Patient's Chart, lab work & pertinent test results  History of Anesthesia Complications Negative for: history of anesthetic complications  Airway Mallampati: II  TM Distance: >3 FB Neck ROM: Full    Dental  (+) Chipped   Pulmonary neg pulmonary ROS, neg sleep apnea, neg COPD, Patient abstained from smoking.Not current smoker,    Pulmonary exam normal breath sounds clear to auscultation       Cardiovascular Exercise Tolerance: Good METShypertension, (-) CAD and (-) Past MI + dysrhythmias Atrial Fibrillation + Valvular Problems/Murmurs MR  Rhythm:Irregular Rate:Normal - Systolic murmurs 1. Left ventricular ejection fraction, by estimation, is 50 to 55%. The  left ventricle has low normal function. The left ventricle has no regional  wall motion abnormalities. Left ventricular diastolic parameters are  indeterminate.  2. Right ventricular systolic function is normal. The right ventricular  size is normal. There is normal pulmonary artery systolic pressure.  3. Left atrial size was mildly dilated.  4. Mild to moderate mitral valve regurgitation.  5. Rhythm is atrial fibrillation    Neuro/Psych negative neurological ROS  negative psych ROS   GI/Hepatic neg GERD  ,(+)     (-) substance abuse  ,   Endo/Other  neg diabetes  Renal/GU CRFRenal disease     Musculoskeletal   Abdominal   Peds  Hematology   Anesthesia Other Findings Past Medical History: No date: Atrial fibrillation ([place]) 2017: Breast cancer ([place])     Comment:  bilateral breast cancer No date: Hypertension     Comment:  OFF MEDS X 6 MONTHS DUE TO CONTROLLED BP  (08-15-15) No date: Osteoporosis  Reproductive/Obstetrics                            Anesthesia Physical Anesthesia Plan  ASA: 2  Anesthesia Plan: General   Post-op Pain Management: Ofirmev IV (intra-op)*   Induction: Intravenous  PONV Risk Score and Plan: 4 or greater and Ondansetron, Dexamethasone and Treatment may vary due to age or medical condition  Airway Management Planned: LMA  Additional Equipment: None  Intra-op Plan:   Post-operative Plan: Extubation in OR  Informed Consent: I have reviewed the patients History and Physical, chart, labs and discussed the procedure including the risks, benefits and alternatives for the proposed anesthesia with the patient or authorized representative who has indicated his/her understanding and acceptance.   Patient has DNR.  Discussed DNR with patient and Suspend DNR.   Dental advisory given  Plan Discussed with: CRNA and Surgeon  Anesthesia Plan Comments: (Discussed risks of anesthesia with patient, including PONV, sore throat, lip/dental/eye damage. Rare risks discussed as well, such as cardiorespiratory and neurological sequelae, and allergic reactions. Discussed the role of CRNA in patient's perioperative care. Patient understands.)        Anesthesia Quick Evaluation

## 2022-02-02 NOTE — Consult Note (Addendum)
ORTHOPAEDIC CONSULTATION  REQUESTING PHYSICIAN: Wouk, Ailene Rud, MD  Chief Complaint:   Left hip pain  History of Present Illness: Cassie Starch, Cassie Lopez is a 81 y.o. female with a history of chronic atrial fibrillation for which she takes Eliquis, hypertension, and osteoporosis who lives in an assisted living facility and ambulates without any assistive devices.  The patient was in her usual state of health until 2 days prior to admission when she apparently lost her balance and fell onto her left hip while taking the lint screen out of her dryer to clean it.  The patient did not seek treatment immediately, but because of continued pain with ambulation, she presented to the emergency room earlier today where x-rays demonstrated the presence of a valgus impacted left femoral neck fracture.  The patient denies any associated injuries.  She did not strike her head or lose consciousness.  The patient also denies any lightheadedness, dizziness, chest pain, shortness of breath, or other symptoms which may have precipitated her fall.  Past Medical History:  Diagnosis Date   Atrial fibrillation ([employer])    Breast cancer ([place]) 2017   bilateral breast cancer   Hypertension    OFF MEDS X 6 MONTHS DUE TO CONTROLLED BP (08-15-15)   Osteoporosis    Past Surgical History:  Procedure Laterality Date   ANKLE SURGERY Left 2011   BILATERAL TOTAL MASTECTOMY WITH AXILLARY LYMPH NODE DISSECTION Bilateral 08/24/2015   Left T2, N22m ER/PR +; Her 2 neu not overexpressed, Right: DCIS.    BREAST BIOPSY Right 08/08/15   stereo/DUCTAL CARCINOMA IN SITU    BREAST BIOPSY Left 08-15-15   INVASIVE CARCINOMA, dr byrnett   CATARACT EXTRACTION W/PHACO Left 04/10/2017   Procedure: CATARACT EXTRACTION PHACO AND INTRAOCULAR LENS PLACEMENT (IOC);  Surgeon: BLeandrew Koyanagi MD;  Location: [facility] ORS;  Service: Ophthalmology;  Laterality: Left;  [place]:13.3 AP% 14.8 CDE  10.81 Fluid Pack  lot # [id number]   CATARACT EXTRACTION W/PHACO Right 05/14/2017   Procedure: CATARACT EXTRACTION PHACO AND INTRAOCULAR LENS PLACEMENT ([place]  [place];  Surgeon: BLeandrew Koyanagi MD;  [place] [facility]  Service: Ophthalmology;  Laterality: Right;   FRACTURE SURGERY     left breast excision Left    MASTECTOMY Bilateral 08/24/2015   SENTINEL NODE BIOPSY Bilateral 08/24/2015   Procedure: SENTINEL NODE BIOPSY;  Surgeon: JRobert Bellow MD;  Location: [facility] ORS;  Service: General;  Laterality: Bilateral;   WRIST FRACTURE SURGERY     Social History   Socioeconomic History   Marital status: Widowed    Spouse name: Not on file   Number of children: Not on file   Years of education: Not on file   Highest education level: Not on file  Occupational History   Not on file  Tobacco Use   Smoking status: Never   Smokeless tobacco: Never  Vaping Use   Vaping Use: Never used  Substance and Sexual Activity   Alcohol use: No    Alcohol/week: 0.0 standard drinks of alcohol   Drug use: No   Sexual activity: Not on file  Other Topics Concern   Not on file  Social History Narrative   [place] [used to live in independent living in April 2021]; never smoked; no alcohol;    Social Determinants of Health   Financial Resource Strain: Not on file  Food Insecurity: Not on file  Transportation Needs: Not on file  Physical Activity: Not on file  Stress: Not on file  Social  Connections: Not on file   Family History  Problem Relation Age of Onset   Cancer Neg Hx    Allergies  Allergen Reactions   Bactericin [Bacitracin] Rash    bactrim   Prior to Admission medications   Medication Sig Start Date End Date Taking? Authorizing Provider  apixaban (ELIQUIS) 5 MG TABS tablet Take 1 tablet (5 mg total) by mouth 2 (two) times daily. 11/10/19 02/01/22 Yes Lilia Pro., MD  B Complex-Biotin-FA (SUPER QUINTS B-50) TABS Take by mouth.   Yes [provider]  Cholecalciferol (VITAMIN D3) 1000 units CAPS Take 2,000 Units by mouth daily.    Yes [provider]  diltiazem (CARDIZEM CD) 240 MG 24 hr capsule Take 1 capsule (240 mg total) by mouth daily. 12/04/19  Yes Lavina Hamman, MD  letrozole [facility]) 2.5 MG tablet Take 1 tablet (2.5 mg total) by mouth daily. 09/22/18  Yes Byrnett, Forest Gleason, MD  mirtazapine (REMERON) 15 MG tablet Take 15 mg by mouth at bedtime.   Yes [provider]  Omega-3 Fatty Acids (FISH OIL) 1000 MG CAPS Take 1,000 mg by mouth daily.    Yes [provider]  QUEtiapine (SEROQUEL) 25 MG tablet Take 25 mg by mouth at bedtime.   Yes [provider]  aspirin 81 MG tablet Take 81 mg by mouth daily.  Patient not taking: Reported on 02/01/2022    [provider]  calcium carbonate (CALCIUM 600) 600 MG TABS tablet Take 1,200 mg by mouth daily with breakfast.  Patient not taking: Reported on 02/01/2022    [provider]  furosemide (LASIX) 20 MG tablet Take 20 mg by mouth daily. Patient not taking: Reported on 02/01/2022 02/15/20   [provider]  Selenium 200 MCG CAPS Take 200 mcg by mouth daily.  Patient not taking: Reported on 02/01/2022    [provider]   DG C-Arm 1-60 Min-No Report  Result Date: 02/02/2022 Fluoroscopy was utilized by the requesting physician.  No radiographic interpretation.   CT Head Wo Contrast  Result Date: 02/01/2022 CLINICAL DATA:  Head trauma, minor (Age >= 65y); Neck trauma (Age >= 65y) EXAM: CT HEAD WITHOUT CONTRAST CT CERVICAL SPINE WITHOUT CONTRAST TECHNIQUE: Multidetector CT imaging of the head and cervical spine was performed following the standard protocol without intravenous contrast. Multiplanar CT image reconstructions of the cervical spine were also generated. RADIATION DOSE REDUCTION: This exam was performed according to the departmental dose-optimization program which includes automated exposure control,  adjustment of the mA and/or kV according to patient size and/or use of iterative reconstruction technique. COMPARISON:  None Available. FINDINGS: CT HEAD FINDINGS Brain: No evidence of acute infarction, hemorrhage, hydrocephalus, extra-axial collection or mass lesion/mass effect. Please note a small portion of the most superficial right cerebral convexity is not imaged due to patient positioning. Vascular: No hyperdense vessel identified. Skull: No evidence of acute fracture. Chronic sclerotic right calvarial lesion, probably benign. Sinuses/Orbits: Largely clear sinuses.  No acute orbital findings. Other: No mastoid effusions. CT CERVICAL SPINE FINDINGS Alignment: No substantial sagittal subluxation. Skull base and vertebrae: Vertebral body heights are maintained. No evidence of acute fracture. Soft tissues and spinal canal: No prevertebral fluid or swelling. No visible canal hematoma. Disc levels: Multilevel degenerative disease, greatest at C4-C5 and C5-C6. Upper chest: Visualized lung apices are clear. Other: Large (3.4 cm) heterogeneous mass, arising from or adjacent to the left thyroid gland. IMPRESSION: 1. No evidence of acute intracranial abnormality. 2. No evidence of acute fracture  or traumatic malalignment in the cervical spine. 3. Large (3.4 cm) heterogeneous mass arising from or adjacent to the left thyroid gland. Recommend thyroid ultrasound (ref: J Am Coll Radiol. 2015 Feb;12(2): 143-50). Electronically Signed   By: Margaretha Sheffield M.D.   On: 02/01/2022 09:47   CT Cervical Spine Wo Contrast  Result Date: 02/01/2022 CLINICAL DATA:  Head trauma, minor (Age >= 65y); Neck trauma (Age >= 65y) EXAM: CT HEAD WITHOUT CONTRAST CT CERVICAL SPINE WITHOUT CONTRAST TECHNIQUE: Multidetector CT imaging of the head and cervical spine was performed following the standard protocol without intravenous contrast. Multiplanar CT image reconstructions of the cervical spine were also generated. RADIATION DOSE  REDUCTION: This exam was performed according to the departmental dose-optimization program which includes automated exposure control, adjustment of the mA and/or kV according to patient size and/or use of iterative reconstruction technique. COMPARISON:  None Available. FINDINGS: CT HEAD FINDINGS Brain: No evidence of acute infarction, hemorrhage, hydrocephalus, extra-axial collection or mass lesion/mass effect. Please note a small portion of the most superficial right cerebral convexity is not imaged due to patient positioning. Vascular: No hyperdense vessel identified. Skull: No evidence of acute fracture. Chronic sclerotic right calvarial lesion, probably benign. Sinuses/Orbits: Largely clear sinuses.  No acute orbital findings. Other: No mastoid effusions. CT CERVICAL SPINE FINDINGS Alignment: No substantial sagittal subluxation. Skull base and vertebrae: Vertebral body heights are maintained. No evidence of acute fracture. Soft tissues and spinal canal: No prevertebral fluid or swelling. No visible canal hematoma. Disc levels: Multilevel degenerative disease, greatest at C4-C5 and C5-C6. Upper chest: Visualized lung apices are clear. Other: Large (3.4 cm) heterogeneous mass, arising from or adjacent to the left thyroid gland. IMPRESSION: 1. No evidence of acute intracranial abnormality. 2. No evidence of acute fracture or traumatic malalignment in the cervical spine. 3. Large (3.4 cm) heterogeneous mass arising from or adjacent to the left thyroid gland. Recommend thyroid ultrasound (ref: J Am Coll Radiol. 2015 Feb;12(2): 143-50). Electronically Signed   By: Margaretha Sheffield M.D.   On: 02/01/2022 09:47   DG Hip Unilat W or Wo Pelvis 2-3 Views Left  Result Date: 02/01/2022 CLINICAL DATA:  Left hip pain after fall EXAM: DG HIP (WITH OR WITHOUT PELVIS) 2-3V LEFT COMPARISON:  None Available. FINDINGS: There is an impacted fracture of the left femoral neck with a cortical break seen along the medial aspect of  the head/neck junction. Femoroacetabular alignment is maintained. No other fracture is seen. Right hip alignment is normal. The SI joints and symphysis pubis are intact. IMPRESSION: Impacted fracture of the left femoral neck. Electronically Signed   By: Valetta Mole M.D.   On: 02/01/2022 09:26   DG Chest 1 View  Result Date: 02/01/2022 CLINICAL DATA:  Fall from standing EXAM: CHEST  1 VIEW COMPARISON:  PET-CT 01/10/2020 FINDINGS: Cardiomediastinal silhouette and pulmonary vasculature are within normal limits. Lungs are clear. IMPRESSION: No acute cardiopulmonary process. Electronically Signed   By: Miachel Roux M.D.   On: 02/01/2022 09:23   DG Knee 2 Views Left  Result Date: 02/01/2022 CLINICAL DATA:  Left hip and knee pain status post fall EXAM: LEFT KNEE - 1-2 VIEW COMPARISON:  None available FINDINGS: No fracture or dislocation. Soft tissues are normal. Mild diffuse osteopenia. IMPRESSION: No acute abnormality of the left knee. Electronically Signed   By: Miachel Roux M.D.   On: 02/01/2022 09:22    Positive ROS: All other systems have been reviewed and were otherwise negative with the exception of those mentioned in  the HPI and as above.  Physical Exam: General:  Alert, no acute distress Psychiatric:  Patient appears to be competent for consent with normal mood and affect   Cardiovascular:  No pedal edema Respiratory:  No wheezing, non-labored breathing GI:  Abdomen is soft and non-tender Skin:  No lesions in the area of chief complaint Neurologic:  Sensation intact distally Lymphatic:  No axillary or cervical lymphadenopathy  Orthopedic Exam:  Orthopedic examination is limited to the left hip and lower extremity.  The left leg appears to be symmetrically aligned to the right leg.  Skin inspection around the left hip is unremarkable.  No swelling, erythema, ecchymosis, abrasions, or other skin abnormalities are identified.  She has mild tenderness palpation of the anterior aspect of the  left hip.  She has more significant pain with attempted active or passive motion of the hip.  She is neurovascularly intact to the left lower extremity and foot.  X-rays:  X-rays of the pelvis and left hip are available for review and have been reviewed by myself.  These films demonstrate a valgus impacted left femoral neck fracture with overall excellent alignment on the lateral view.  No significant degenerative changes of the left hip are noted.  No lytic lesions or other acute bony abnormalities are identified.  Assessment: Valgus impacted left femoral neck fracture.  Plan: The treatment options, including both surgical and nonsurgical choices, have been discussed in detail with the patient.  The patient would like to proceed with surgical intervention to include an in situ cannulated screw fixation of the valgus impacted left femoral neck fracture.  The risks (including bleeding, infection, nerve and/or blood vessel injury, persistent or recurrent pain, loosening or failure of the components, leg length inequality, dislocation, need for further surgery, blood clots, strokes, heart attacks or arrhythmias, pneumonia, etc.) and benefits of the surgical procedure were discussed.  The patient states her understanding and agrees to proceed.  A formal written consent will be obtained by the nursing staff.  Thank you for asking me to participate in the care of this most delightful woman.  I will be happy to follow her with you.   Cassie Lux, MD  Beeper #:  [phone]  02/01/2022 4:46 PM

## 2022-02-02 NOTE — Transfer of Care (Signed)
Immediate Anesthesia Transfer of Care Note  Patient: Lucrezia Starch, PhD  Procedure(s) Performed: CANNULATED HIP PINNIN-Valgus (Left: Hip)  Patient Location: PACU  Anesthesia Type:General  Level of Consciousness: drowsy  Airway & Oxygen Therapy: Patient Spontanous Breathing and Patient connected to nasal cannula oxygen  Post-op Assessment: Report given to RN and Post -op Vital signs reviewed and stable  Post vital signs: Reviewed and stable  Last Vitals:  Vitals Value Taken Time  BP 131/81 02/02/22 0845  Temp 84f  Pulse 133 02/02/22 0847  Resp 17 02/02/22 0847  SpO2 94 % 02/02/22 0847  Vitals shown include unvalidated device data.  Last Pain:  Vitals:   02/01/22 2200  TempSrc:   PainSc: 0-No pain         Complications: No notable events documented.

## 2022-02-02 NOTE — Progress Notes (Signed)
Patient transported to OR.

## 2022-02-03 DIAGNOSIS — S72002A Fracture of unspecified part of neck of left femur, initial encounter for closed fracture: Secondary | ICD-10-CM | POA: Diagnosis not present

## 2022-02-03 LAB — CBC
HCT: 40.1 % (ref 36.0–46.0)
Hemoglobin: 13.5 g/dL (ref 12.0–15.0)
MCH: 29.7 pg (ref 26.0–34.0)
MCHC: 33.7 g/dL (ref 30.0–36.0)
MCV: 88.3 fL (ref 80.0–100.0)
Platelets: 197 10*3/uL (ref 150–400)
RBC: 4.54 MIL/uL (ref 3.87–5.11)
RDW: 13.4 % (ref 11.5–15.5)
WBC: 7 10*3/uL (ref 4.0–10.5)
nRBC: 0 % (ref 0.0–0.2)

## 2022-02-03 LAB — BASIC METABOLIC PANEL
Anion gap: 12 (ref 5–15)
BUN: 33 mg/dL — ABNORMAL HIGH (ref 8–23)
CO2: 21 mmol/L — ABNORMAL LOW (ref 22–32)
Calcium: 8.8 mg/dL — ABNORMAL LOW (ref 8.9–10.3)
Chloride: 106 mmol/L (ref 98–111)
Creatinine, Ser: 0.94 mg/dL (ref 0.44–1.00)
GFR, Estimated: >60 mL/min (ref >60)
Glucose, Bld: 130 mg/dL — ABNORMAL HIGH (ref 70–99)
Potassium: 3.7 mmol/L (ref 3.5–5.1)
Sodium: 139 mmol/L (ref 135–145)

## 2022-02-03 MED ORDER — ACETAMINOPHEN 325 MG PO TABS: 650.0000 mg | ORAL_TABLET | Freq: Four times a day (QID) | ORAL | Status: DC | PRN

## 2022-02-03 MED ORDER — OXYCODONE HCL 5 MG PO TABS: 2.5000 mg | ORAL_TABLET | ORAL | 0 refills | Status: DC | PRN

## 2022-02-03 NOTE — Evaluation (Addendum)
Occupational Therapy Evaluation Patient Details Name: Cassie JAMROZ, PhD MRN: [id number] DOB: [birth year] Today's Date: 02/03/2022   History of Present Illness The patient is a 81 year old female with PMHx significant for atrial fibrillation on eliquis, dementia, breast cancer, and HTN, who sustained a valgus-impacted left femoral neck fracture 3 days ago when she apparently lost her balance and fell. Initially, she did not seek treatment, but because of continued pain with ambulation, she presented to the emergency room yesterday afternoon where x-rays demonstrated the above-noted injury. The patient presented to the emergency room where x-rays demonstrated the above-noted findings. The patient is now s/p in situ cannulated screw fixation of valgus-impacted left femoral neck fracture.   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  Prior to admission, she lived in an independent living community.  She received assist for bathing, cooking, and driving, but was able to complete all other BADLs independently without AD.  Currently, patient requires setup assist for seated UB ADLs (grooming, bathing, feeding, dressing) and requires increased level of assist for lower body dressing, bathing, and toileting.  OT provided min assist for bed mobility and sit to stand transfer with RW.  Pt was unable to progress further in ambulation due to reported discomfort with walking.  She was able to take one side step toward left side with min assist from OT and RW.  Pt declined to utilize [facility] using stand pivot transfer due to concern for privacy (preferred to use bedpan).  [name] will continue to benefit from skilled OT services in acute setting to support functional strengthening, ROM, and safety and independence in ADLs.  Recommend short term rehab upon  discharge to further support these goals.      Recommendations for follow up therapy are one component of a multi-disciplinary discharge planning process, led by the attending physician.  Recommendations may be updated based on patient status, additional functional criteria and insurance authorization.   Follow Up Recommendations  Skilled nursing-short term rehab (<3 hours/day)    Assistance Recommended at Discharge Frequent or constant Supervision/Assistance  Patient can return home with the following A lot of help with walking and/or transfers;A lot of help with bathing/dressing/bathroom;Assistance with cooking/housework;Direct supervision/assist for medications management;Assist for transportation;Help with stairs or ramp for entrance    Functional Status Assessment  Patient has had a recent decline in their functional status and demonstrates the ability to make significant improvements in function in a reasonable and predictable amount of time.  Equipment Recommendations   (defer to next level of care)    Recommendations for Other Services       Precautions / Restrictions Precautions Precautions: Fall Restrictions Weight Bearing Restrictions: Yes LLE Weight Bearing: Partial weight bearing LLE Partial Weight Bearing Percentage or Pounds: 50% PWB LLE      Mobility Bed Mobility Overal bed mobility: Needs Assistance Bed Mobility: Supine to Sit, Sit to Supine     Supine to sit: Min assist Sit to supine: Min assist   General bed mobility comments: required assist to shift hips, HOB elevated Patient Response: Cooperative  Transfers Overall transfer level: Needs assistance Equipment used: Rolling walker (2 wheels) Transfers: Sit to/from Stand Sit to Stand: Min assist                  Balance Overall balance assessment: Needs assistance Sitting-balance support: No upper extremity supported  Sitting balance-Leahy Scale: Fair       Standing balance-Leahy Scale:  Poor Standing balance comment: use of RW for BUE support                           ADL either performed or assessed with clinical judgement   ADL Overall ADL's : Needs assistance/impaired Eating/Feeding: Set up;Sitting   Grooming: Set up;Sitting Grooming Details (indicate cue type and reason): performed oral hygiene and brushed hair while seated EOB with setup assist Upper Body Bathing: Set up;Sitting     Lower Body Bathing Details (indicate cue type and reason): not tested, anticipate min-mod assist Upper Body Dressing : Set up;Sitting     Lower Body Dressing Details (indicate cue type and reason): not tested, anticipate min-mod assist   Toilet Transfer Details (indicate cue type and reason): pt declined, anticipate mod assist needed for stand pivot transfer to [facility] with RW   Toileting - Clothing Manipulation Details (indicate cue type and reason): not tested   Tub/Shower Transfer Details (indicate cue type and reason): not tested Functional mobility during ADLs: Rolling walker (2 wheels) General ADL Comments: Pt able to complete seated UB ADLs with setup assist.  She requires min assist for sit to stand transfer with RW, though declined to participate in further transfers or mobility.     Vision Baseline Vision/History: 0 No visual deficits Patient Visual Report: No change from baseline       Perception     Praxis      Pertinent Vitals/Pain Pain Assessment Pain Assessment: No/denies pain     Hand Dominance Right   Extremity/Trunk Assessment Upper Extremity Assessment Upper Extremity Assessment: Overall WFL for tasks assessed   Lower Extremity Assessment Lower Extremity Assessment: LLE deficits/detail LLE Deficits / Details: expected post-op limitations in strength, ROM, and pain level LLE: Unable to fully assess due to pain LLE Sensation: WNL   Cervical / Trunk Assessment Cervical / Trunk Assessment: Normal   Communication  Communication Communication: No difficulties   Cognition Arousal/Alertness: Awake/alert Behavior During Therapy: WFL for tasks assessed/performed Overall Cognitive Status: History of cognitive impairments - at baseline                                 General Comments: Dementia at baseline, however A&O x3, use of board to determine the date.  Some short term memory deficits noted.     General Comments       Exercises Other Exercises Other Exercises: provided education re: OT role and plan of care, fall and safety precautions, self care, setup assist for seated grooming, min assist for bed mobility and sit to stand transfers   Shoulder Instructions      Home Living Family/patient expects to be discharged to:: Assisted living                             Home Equipment: Rollator (4 wheels)          Prior Functioning/Environment Prior Level of Function : Independent/Modified Independent;History of Falls (last six months)             Mobility Comments: reports having a rollator, however did not use it for mobility. ADLs Comments: Pt able to dress, use bathroom, groom, and self-feed independently.  She receives assist for bathing.  Pt independent in household maintenance, though her ILF  provides meals.  She does not drive.        OT Problem List: Decreased strength;Decreased range of motion;Decreased activity tolerance;Impaired balance (sitting and/or standing);Decreased knowledge of use of DME or AE;Decreased knowledge of precautions;Pain      OT Treatment/Interventions: Self-care/ADL training;Therapeutic exercise;Energy conservation;DME and/or AE instruction;Therapeutic activities;Patient/family education;Balance training    OT Goals(Current goals can be found in the care plan section) Acute Rehab OT Goals Patient Stated Goal: to return to hobbies OT Goal Formulation: With patient Time For Goal Achievement: 02/17/22 Potential to Achieve Goals:  Good ADL Goals Pt Will Perform Lower Body Dressing: with min assist;with adaptive equipment;sit to/from stand (with LRAD) Pt Will Transfer to Toilet: with min assist;bedside commode;stand pivot transfer (with LRAD) Additional ADL Goal #1: Pt will verbalize x3 fall prevention strategies with min verbal cues to incorporate into routine to support safety and independence with ADLs.  OT Frequency: Min 2X/week    Co-evaluation              AM-PAC OT "6 Clicks" Daily Activity     Outcome Measure Help from another person eating meals?: A Little Help from another person taking care of personal grooming?: A Little Help from another person toileting, which includes using toliet, bedpan, or urinal?: A Lot Help from another person bathing (including washing, rinsing, drying)?: A Lot Help from another person to put on and taking off regular upper body clothing?: A Little Help from another person to put on and taking off regular lower body clothing?: A Lot 6 Click Score: 15   End of Session Equipment Utilized During Treatment: Rolling walker (2 wheels)  Activity Tolerance: Patient tolerated treatment well Patient left: in bed;with call bell/phone within reach;with bed alarm set  OT Visit Diagnosis: Other abnormalities of gait and mobility (R26.89)                Time: 9563-8756 OT Time Calculation (min): 41 min Charges:  OT General Charges $OT Visit: 1 Visit OT Evaluation $OT Eval Moderate Complexity: 1 Mod OT Treatments $Self Care/Home Management : 23-37 mins  Jeneen Montgomery, OTR/L 02/03/22, 12:21 PM

## 2022-02-03 NOTE — NC FL2 (Signed)
[place] LEVEL OF CARE SCREENING TOOL     IDENTIFICATION  Patient Name: Cassie Starch, PhD Birthdate: 1940/10/03 Sex: female Admission Date (Current Location): 02/01/2022  [facility] and [place] Number:  Engineering geologist and Address:  [facility], [address] [place], [place] [address]      Provider Number: [id number]  Attending Physician Name and Address:  Gwynne Edinger, MD  Relative Name and Phone Number:  Moise Boring (Sister)   [phone] [facility])    Current Level of Care:   Recommended Level of Care:   Prior Approval Number: [id number]  Date Approved/Denied: 12/08/19 PASRR Number: [id number]  Discharge Plan: SNF    Current Diagnoses: Patient Active Problem List   Diagnosis Date Noted   Fracture of femoral neck, left ([place]) 02/01/2022   Dementia arising in the senium and presenium ([place]) 12/06/2019   AMS (altered mental status) 12/01/2019   Atrial fibrillation with RVR ([place]) 12/01/2019   Essential hypertension 12/01/2019   History of breast cancer 12/01/2019   S/P bilateral mastectomy 12/01/2019   Carcinoma of upper-outer quadrant of left breast in female, estrogen receptor positive ([place]) 08/18/2015   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   Osteoporosis 11/22/2011    Orientation RESPIRATION BLADDER Height & Weight     Self, Time, Place (Need reinforcement)  Normal External catheter Weight: 77 kg Height:  '5\' 5"'$  (165.1 cm)  BEHAVIORAL SYMPTOMS/MOOD NEUROLOGICAL BOWEL NUTRITION STATUS      Continent Diet  AMBULATORY STATUS COMMUNICATION OF NEEDS Skin   Extensive Assist (Needs cuuing for safety moves) Verbally Surgical wounds, Other (Comment) (Left Hip surgical wound/Dressing)                       Personal Care Assistance Level of Assistance  Bathing, Dressing, Feeding Bathing Assistance: Maximum assistance Feeding assistance: Maximum assistance Dressing Assistance: Maximum assistance      Functional Limitations Info  Sight (Has Glasses) Sight Info: Impaired (Corrective glasses)        SPECIAL CARE FACTORS FREQUENCY  PT (By licensed PT), OT (By licensed OT)     PT Frequency: 5x/week OT Frequency: 5x/week            Contractures Contractures Info: Not present    Additional Factors Info  Code Status, Allergies Code Status Info: DNR Please see full description in chart. Allergies Info: Bactrim: No reaction listed. Please check for updates.           Current Medications (02/03/2022):  This is the current hospital active medication list Current Facility-Administered Medications  Medication Dose Route Frequency Provider Last Rate Last Admin   0.9 %  sodium chloride infusion   Intravenous PRN Si Raider Ailene Rud, MD 10 mL/hr at 02/02/22 2126 New Bag at 02/02/22 2126   acetaminophen (TYLENOL) tablet 650 mg  650 mg Oral Q6H PRN Poggi, Marshall Cork, MD       apixaban Arne Cleveland) tablet 5 mg  5 mg Oral BID Poggi, Marshall Cork, MD   5 mg at 02/03/22 5701   bisacodyl (DULCOLAX) suppository 10 mg  10 mg Rectal Daily PRN Poggi, Marshall Cork, MD       cholecalciferol (VITAMIN D3) tablet 2,000 Units  2,000 Units Oral Daily Lorna Dibble, RPH   2,000 Units at 02/03/22 7793   diltiazem (CARDIZEM CD) 24 hr capsule 120 mg  120 mg Oral Daily Poggi, Marshall Cork, MD   120 mg at 02/03/22 0929   diphenhydrAMINE (BENADRYL)  12.5 MG/5ML elixir 12.5-25 mg  12.5-25 mg Oral Q4H PRN Poggi, Marshall Cork, MD       docusate sodium (COLACE) capsule 100 mg  100 mg Oral BID Poggi, Marshall Cork, MD   100 mg at 02/03/22 7793   HYDROmorphone (DILAUDID) injection 0.5 mg  0.5 mg Intravenous Q4H PRN Poggi, Marshall Cork, MD       letrozole [facility]) tablet 2.5 mg  2.5 mg Oral Daily Gwynne Edinger, MD   2.5 mg at 02/03/22 9030   magnesium hydroxide (MILK OF MAGNESIA) suspension 30 mL  30 mL Oral Daily PRN Poggi, Marshall Cork, MD       metoCLOPramide (REGLAN) tablet 5-10 mg  5-10 mg Oral Q8H PRN Poggi, Marshall Cork, MD   10 mg at 02/03/22 0923   Or    metoCLOPramide (REGLAN) injection 5-10 mg  5-10 mg Intravenous Q8H PRN Poggi, Marshall Cork, MD       metoprolol tartrate (LOPRESSOR) injection 2.5 mg  2.5 mg Intravenous Q6H PRN Poggi, Marshall Cork, MD       mirtazapine (REMERON) tablet 15 mg  15 mg Oral QHS Poggi, Marshall Cork, MD   15 mg at 02/02/22 2107   mupirocin ointment (BACTROBAN) 2 % 1 Application  1 Application Nasal BID Poggi, Marshall Cork, MD   1 Application at 30/07/62 1340   ondansetron (ZOFRAN) tablet 4 mg  4 mg Oral Q6H PRN Poggi, Marshall Cork, MD       Or   ondansetron (ZOFRAN) injection 4 mg  4 mg Intravenous Q6H PRN Poggi, Marshall Cork, MD       oxyCODONE (Oxy IR/ROXICODONE) immediate release tablet 5 mg  5 mg Oral Q4H PRN Poggi, Marshall Cork, MD   5 mg at 02/03/22 2633   polyethylene glycol (MIRALAX / GLYCOLAX) packet 17 g  17 g Oral BID Gwynne Edinger, MD   17 g at 02/03/22 3545   QUEtiapine (SEROQUEL) tablet 25 mg  25 mg Oral QHS Corky Mull, MD   25 mg at 02/02/22 2107   senna-docusate (Senokot-S) tablet 1 tablet  1 tablet Oral QHS Corky Mull, MD   1 tablet at 02/02/22 2108   sodium phosphate (FLEET) 7-19 GM/118ML enema 1 enema  1 enema Rectal Once PRN Poggi, Marshall Cork, MD         Discharge Medications: Please see discharge summary for a list of discharge medications.  Relevant Imaging Results:  Relevant Lab Results:   Additional Information SS# [id number]  Izola Price, RN

## 2022-02-03 NOTE — Progress Notes (Signed)
PROGRESS NOTE    Cassie Starch, PhD  LKG:[id number] DOB: [birth year] DOA: 02/01/2022 PCP: Kirk Ruths, MD     Brief Narrative:   From admission h and p Cassie Starch, PhD is a 81 y.o. female with medical history significant for permanent atrial fibrillation on Eliquis, dementia, breast cancer on letrozole, hypertension, who presented to [facility] from assisted living facility after a mechanical fall at home.  Her fall occurred yesterday, on 01/31/2022 AM.  Since then, the patient has been unable to put any weight on her left lower extremity.  Due to persistent pain in her left hip she presented to the ED today for further evaluation.  Imaging reveals left femoral neck fracture.  Last dose of Eliquis was yesterday evening.     Assessment & Plan:   Principal Problem:   Fracture of femoral neck, left (HCC) Active Problems:   Atrial fibrillation with RVR (HCC)   Essential hypertension   History of breast cancer  # Left femoral neck fracture S/p operative repair today w/ dr. Roland Rack. Hgb wnl. - home apixaban resumed - pain control, bowel regimen - pt/ot advise snf, TOC working on that  # Hx breast cancer - cont home letrozole  # Ckd 3a Kidney function at baseline and in fact gfr today is greater than 60 - monitor  # Chronic a fib Rate controlled - cont home dilt, adjust as needed - apixaban    # Dementia Lives in assisted living, [place]  # MDD - cont home mirtazapine, quetiapine  # Hypokalemia - monitor and replete as needed  # HFpEF Euvolemic, no longer on lasix - monitor    DVT prophylaxis: SCDs Code Status: DNR Family Communication: sister Opal Sidles updated telephonically 7/22  Level of care: Telemetry Surgical Status is: Inpatient Remains inpatient appropriate because: needs SNF bed, looking for that    Consultants:  orthopedics  Procedures: 7/22: In situ cannulated screw fixation of valgus-impacted left femoral neck fracture.  Antimicrobials:   Surgical ppx   Subjective: No pain, tolerating diet  Objective: Vitals:   02/02/22 1528 02/02/22 2104 02/03/22 0408 02/03/22 0744  BP: 133/90 126/69 (!) 128/97 126/89  Pulse: 91 94 75 86  Resp:  '16 14 18  '$ Temp: 97.7 F (36.5 C) (!) 97.4 F (36.3 C) 97.8 F (36.6 C) 97.6 F (36.4 C)  TempSrc: Oral  Oral   SpO2: 94% 96% 98% 97%  Weight:      Height:        Intake/Output Summary (Last 24 hours) at 02/03/2022 1423 Last data filed at 02/03/2022 1035 Gross per 24 hour  Intake 395.55 ml  Output --  Net 395.55 ml   Filed Weights   02/02/22 0500  Weight: 77 kg    Examination:  General exam: Appears calm and comfortable  Respiratory system: Clear to auscultation. Respiratory effort normal. Cardiovascular system: S1 & S2 heard, irreg, mild tachy, soft systolic murmur Gastrointestinal system: Abdomen is nondistended, soft and nontender. No organomegaly or masses felt. Normal bowel sounds heard. Central nervous system: Alert and oriented. No focal neurological deficits. Extremities: Symmetric 5 x 5 power. Skin: left lateral hip dressing c/d/i Psychiatry:  calm    Data Reviewed: I have personally reviewed following labs and imaging studies  CBC: Recent Labs  Lab 02/01/22 1040 02/02/22 0509 02/03/22 0601  WBC 7.4 6.3 7.0  NEUTROABS 5.9  --   --   HGB 13.9 13.7 13.5  HCT 41.9 41.2 40.1  MCV 91.1 90.0 88.3  PLT 199  202 419   Basic Metabolic Panel: Recent Labs  Lab 02/01/22 1040 02/02/22 0509 02/03/22 0601  NA 141 140 139  K 3.0* 3.6 3.7  CL 106 108 106  CO2 26 21* 21*  GLUCOSE 110* 100* 130*  BUN 21 29* 33*  CREATININE 1.12* 1.12* 0.94  CALCIUM 8.8* 8.7* 8.8*  MG  --  2.0  --   PHOS  --  3.5  --    GFR: Estimated Creatinine Clearance: 46.5 mL/min (by C-G formula based on SCr of 0.94 mg/dL). Liver Function Tests: No results for input(s): "AST", "ALT", "ALKPHOS", "BILITOT", "PROT", "ALBUMIN" in the last 168 hours. No results for input(s): "LIPASE",  "AMYLASE" in the last 168 hours. No results for input(s): "AMMONIA" in the last 168 hours. Coagulation Profile: Recent Labs  Lab 02/02/22 0509  INR 1.3*   Cardiac Enzymes: No results for input(s): "CKTOTAL", "CKMB", "CKMBINDEX", "TROPONINI" in the last 168 hours. BNP (last 3 results) No results for input(s): "PROBNP" in the last 8760 hours. HbA1C: No results for input(s): "HGBA1C" in the last 72 hours. CBG: No results for input(s): "GLUCAP" in the last 168 hours. Lipid Profile: No results for input(s): "CHOL", "HDL", "LDLCALC", "TRIG", "CHOLHDL", "LDLDIRECT" in the last 72 hours. Thyroid Function Tests: No results for input(s): "TSH", "T4TOTAL", "FREET4", "T3FREE", "THYROIDAB" in the last 72 hours. Anemia Panel: No results for input(s): "VITAMINB12", "FOLATE", "FERRITIN", "TIBC", "IRON", "RETICCTPCT" in the last 72 hours. Urine analysis:    Component Value Date/Time   COLORURINE AMBER (A) 12/01/2019 1925   APPEARANCEUR CLOUDY (A) 12/01/2019 1925   LABSPEC 1.020 12/01/2019 1925   PHURINE 5.0 12/01/2019 1925   GLUCOSEU NEGATIVE 12/01/2019 1925   HGBUR NEGATIVE 12/01/2019 1925   BILIRUBINUR NEGATIVE 12/01/2019 1925   KETONESUR 80 (A) 12/01/2019 1925   PROTEINUR 100 (A) 12/01/2019 1925   NITRITE NEGATIVE 12/01/2019 1925   LEUKOCYTESUR NEGATIVE 12/01/2019 1925   Sepsis Labs: '@LABRCNTIP'$ (procalcitonin:4,lacticidven:4)  ) Recent Results (from the past 240 hour(s))  Surgical PCR screen     Status: None   Collection Time: 02/01/22  6:20 PM   Specimen: Nasal Mucosa; Nasal Swab  Result Value Ref Range Status   MRSA, PCR NEGATIVE NEGATIVE Final   Staphylococcus aureus NEGATIVE NEGATIVE Final    Comment: (NOTE) The Xpert SA Assay (FDA approved for NASAL specimens in patients 13 years of age and older), is one component of a comprehensive surveillance program. It is not intended to diagnose infection nor to guide or monitor treatment. Performed at [facility], [address], [place], [place] [address]          Radiology Studies: DG HIP UNILAT WITH PELVIS 2-3 VIEWS LEFT  Result Date: 02/02/2022 CLINICAL DATA:  Left hip pinning. EXAM: OPERATIVE left HIP (WITH PELVIS IF PERFORMED) 11 VIEWS TECHNIQUE: Fluoroscopic spot image(s) were submitted for interpretation post-operatively. Radiation Exposure Index (as provided by the fluoroscopic device): Dose area product 1.36 Gy*cm2 COMPARISON:  Left hip radiographs 02/01/2022 FINDINGS: Left femoral neck fracture again noted. Intraoperative images demonstrate 3 screws across the fracture. IMPRESSION: ORIF of the left femoral neck fracture. Electronically Signed   By: San Morelle M.D.   On: 02/02/2022 08:52   DG C-Arm 1-60 Min-No Report  Result Date: 02/02/2022 Fluoroscopy was utilized by the requesting physician.  No radiographic interpretation.        Scheduled Meds:  apixaban  5 mg Oral BID   cholecalciferol  2,000 Units Oral Daily   diltiazem  120 mg Oral Daily  docusate sodium  100 mg Oral BID   letrozole  2.5 mg Oral Daily   mirtazapine  15 mg Oral QHS   mupirocin ointment  1 Application Nasal BID   polyethylene glycol  17 g Oral BID   QUEtiapine  25 mg Oral QHS   senna-docusate  1 tablet Oral QHS   Continuous Infusions:  sodium chloride 10 mL/hr at 02/02/22 2126     LOS: 2 days     Desma Maxim, MD [facility]   If 7PM-7AM, please contact night-coverage [email or web address] Password [facility] 02/03/2022, 2:23 PM

## 2022-02-03 NOTE — Progress Notes (Signed)
   Subjective: 1 Day Post-Op Procedure(s) (LRB): CANNULATED HIP PINNIN-Valgus (Left) Patient reports pain as mild.   Patient is well, and has had no acute complaints or problems Denies any CP, SOB, ABD pain. We will continue therapy today.    Objective: Vital signs in last 24 hours: Temp:  [97.4 F (36.3 C)-97.8 F (36.6 C)] 97.6 F (36.4 C) (07/23 0744) Pulse Rate:  [75-120] 86 (07/23 0744) Resp:  [14-18] 18 (07/23 0744) BP: (126-133)/(69-97) 126/89 (07/23 0744) SpO2:  [94 %-98 %] 97 % (07/23 0744)  Intake/Output from previous day: 07/22 0701 - 07/23 0700 In: 595.6 [P.O.:120; I.V.:75.6; IV Piggyback:400] Out: 5 [Blood:5] Intake/Output this shift: No intake/output data recorded.  Recent Labs    02/01/22 1040 02/02/22 0509 02/03/22 0601  HGB 13.9 13.7 13.5   Recent Labs    02/02/22 0509 02/03/22 0601  WBC 6.3 7.0  RBC 4.58 4.54  HCT 41.2 40.1  PLT 202 197   Recent Labs    02/02/22 0509 02/03/22 0601  NA 140 139  K 3.6 3.7  CL 108 106  CO2 21* 21*  BUN 29* 33*  CREATININE 1.12* 0.94  GLUCOSE 100* 130*  CALCIUM 8.7* 8.8*   Recent Labs    02/02/22 0509  INR 1.3*    EXAM General - Patient is Alert, Appropriate, and Oriented Extremity - Neurovascular intact Sensation intact distally Intact pulses distally Dorsiflexion/Plantar flexion intact Dressing - dressing C/D/I and no drainage Motor Function - intact, moving foot and toes well on exam.  Patient able to stand with minimal assist  Past Medical History:  Diagnosis Date   Atrial fibrillation ([place])    Breast cancer ([place]) 2017   bilateral breast cancer   Hypertension    OFF MEDS X 6 MONTHS DUE TO CONTROLLED BP (08-15-15)   Osteoporosis     Assessment/Plan:   1 Day Post-Op Procedure(s) (LRB): CANNULATED HIP PINNIN-Valgus (Left) Principal Problem:   Fracture of femoral neck, left (HCC) Active Problems:   Atrial fibrillation with RVR (HCC)   Essential hypertension   History of breast  cancer  Estimated body mass index is 28.25 kg/m as calculated from the following:   Height as of this encounter: '5\' 5"'$  (1.651 m).   Weight as of this encounter: 77 kg. Advance diet Up with therapy, 50% weightbearing left lower extremity Patient doing well, pain well controlled Good progress of physical therapy. Labs and vital signs are stable Care management to assist with discharge.  Continue with Eliquis at discharge TED hose bilateral lower extremity x6 weeks Follow-up with [facility] orthopedics in 2 weeks  DVT Prophylaxis - TED hose and Eliquis SCDs 50% weightbearing left lower extremity   T. Rachelle Hora, PA-C [place] 02/03/2022, 9:52 AM

## 2022-02-03 NOTE — TOC Progression Note (Signed)
Transition of Care [facility]) - Progression Note    Patient Details  Name: Cassie FELTY, Cassie Lopez MRN: [id number] Date of Birth: [birth year]  Transition of Care [facility]) CM/SW Contact  Izola Price, RN Phone Number: 02/03/2022, 12:08 PM  Clinical Narrative:  7/23: Left VM for [facility] SN[facility] as patient is from [facility]. Prior PASRR from 12/08/19 is #[id number]. Submitted via [employer]. Patient has Medicare A/B. Simmie Davies RN CM           Expected Discharge Plan and Services                                                 Social Determinants of Health (SDOH) Interventions    Readmission Risk Interventions     No data to display

## 2022-02-03 NOTE — Evaluation (Signed)
Physical Therapy Evaluation Patient Details Name: Cassie FAIRBAIRN, PhD MRN: [id number] DOB: 23-Nov-1940 Today's Date: 02/03/2022  History of Present Illness  The patient is a 81 year old female with PMHx significant for atrial fibrillation on eliquis, dementia, breast cancer, and HTN, who sustained a valgus-impacted left femoral neck fracture 3 days ago when she apparently lost her balance and fell. Initially, she did not seek treatment, but because of continued pain with ambulation, she presented to the emergency room yesterday afternoon where x-rays demonstrated the above-noted injury. The patient presented to the emergency room where x-rays demonstrated the above-noted findings. The patient is now s/p in situ cannulated screw fixation of valgus-impacted left femoral neck fracture.  Clinical Impression  The pt presents this session in good spirits. She is educated on WB precautions with no recall requiring 100% assistance to reiterate precautions. The pt is able to participate in pre-gait training activity with limited tolerance d/t pain and exertion. At this time the pt will required STR in order to address functional mobility deficits. PT will continue to follow.        Recommendations for follow up therapy are one component of a multi-disciplinary discharge planning process, led by the attending physician.  Recommendations may be updated based on patient status, additional functional criteria and insurance authorization.  Follow Up Recommendations Skilled nursing-short term rehab (<3 hours/day) Can patient physically be transported by private vehicle: No    Assistance Recommended at Discharge Intermittent Supervision/Assistance  Patient can return home with the following  A lot of help with walking and/or transfers;A little help with bathing/dressing/bathroom;Assistance with cooking/housework;Assist for transportation;Help with stairs or ramp for entrance    Equipment Recommendations     Recommendations for Other Services       Functional Status Assessment Patient has had a recent decline in their functional status and demonstrates the ability to make significant improvements in function in a reasonable and predictable amount of time.     Precautions / Restrictions Precautions Precautions: Fall Restrictions Weight Bearing Restrictions: Yes LLE Weight Bearing: Partial weight bearing LLE Partial Weight Bearing Percentage or Pounds: 50% PWB LLE      Mobility  Bed Mobility Overal bed mobility: Needs Assistance Bed Mobility: Supine to Sit     Supine to sit: Min assist          Transfers Overall transfer level: Needs assistance Equipment used: Rolling walker (2 wheels) Transfers: Sit to/from Stand Sit to Stand: Min assist                Ambulation/Gait             Pre-gait activities: Marching in place, pt able to tolerate marching x1 each LE. Limited by pain and faitgue.    Stairs            Wheelchair Mobility    Modified Rankin (Stroke Patients Only)       Balance Overall balance assessment: Needs assistance Sitting-balance support: No upper extremity supported Sitting balance-Leahy Scale: Good     Standing balance support: During functional activity, Reliant on assistive device for balance Standing balance-Leahy Scale: Poor                               Pertinent Vitals/Pain Pain Assessment Pain Assessment: No/denies pain    Home Living Family/patient expects to be discharged to:: Assisted living  Home Equipment: Rollator (4 wheels)      Prior Function Prior Level of Function : Independent/Modified Independent;History of Falls (last six months)             Mobility Comments: reports having a rollator, however did not use it for mobility. ADLs Comments: Pt able to dress, use bathroom, groom, and self-feed independently.  She receives assist for bathing.  Pt independent in  household maintenance, though her ILF provides meals.  She does not drive.     Hand Dominance   Dominant Hand: Right    Extremity/Trunk Assessment   Upper Extremity Assessment Upper Extremity Assessment: Overall WFL for tasks assessed    Lower Extremity Assessment Lower Extremity Assessment: LLE deficits/detail LLE Deficits / Details: expected post-op limitations in strength, ROM, and pain level LLE: Unable to fully assess due to pain LLE Sensation: WNL    Cervical / Trunk Assessment Cervical / Trunk Assessment: Normal  Communication   Communication: No difficulties  Cognition Arousal/Alertness: Awake/alert Behavior During Therapy: WFL for tasks assessed/performed Overall Cognitive Status: History of cognitive impairments - at baseline                                 General Comments: Dementia at baseline, however A&O x3, use of board to determine the date.        General Comments      Exercises Other Exercises Other Exercises: Pt educated on WB precautions and need for skilled assistance at d/c until pt is better able to mobilize with WB precautions or until WB precautions have been changed to allow for greater WBing.   Assessment/Plan    PT Assessment Patient needs continued PT services  PT Problem List Decreased strength;Decreased mobility;Decreased coordination;Decreased activity tolerance;Decreased balance;Decreased cognition;Decreased knowledge of precautions       PT Treatment Interventions Therapeutic activities;Gait training;Therapeutic exercise;DME instruction;Balance training;Functional mobility training;Patient/family education    PT Goals (Current goals can be found in the Care Plan section)  Acute Rehab PT Goals Patient Stated Goal: progress with PT PT Goal Formulation: With patient Time For Goal Achievement: 02/17/22 Potential to Achieve Goals: Good    Frequency 7X/week     Co-evaluation               AM-PAC PT "6  Clicks" Mobility  Outcome Measure Help needed turning from your back to your side while in a flat bed without using bedrails?: A Little Help needed moving from lying on your back to sitting on the side of a flat bed without using bedrails?: A Little Help needed moving to and from a bed to a chair (including a wheelchair)?: A Lot Help needed standing up from a chair using your arms (e.g., wheelchair or bedside chair)?: A Little Help needed to walk in hospital room?: Total Help needed climbing 3-5 steps with a railing? : Total 6 Click Score: 13    End of Session   Activity Tolerance: Patient tolerated treatment well Patient left: in bed (sitting at EOB with OT.) Nurse Communication: Mobility status PT Visit Diagnosis: Unsteadiness on feet (R26.81);Muscle weakness (generalized) (M62.81);History of falling (Z91.81);Difficulty in walking, not elsewhere classified (R26.2)    Time: 1062-6948 PT Time Calculation (min) (ACUTE ONLY): 47 min   Charges:   PT Evaluation $PT Eval Moderate Complexity: 1 Mod PT Treatments $Gait Training: 23-37 mins        10:49 AM, 02/03/22 Andrez Lieurance A. Saverio Danker, PT, DPT Physical Therapist -  [facility]    Suhani Stillion A Armando Lauman 02/03/2022, 10:47 AM

## 2022-02-04 ENCOUNTER — Encounter: Payer: Self-pay | Admitting: Surgery

## 2022-02-04 DIAGNOSIS — N182 Chronic kidney disease, stage 2 (mild): Secondary | ICD-10-CM

## 2022-02-04 DIAGNOSIS — S72002A Fracture of unspecified part of neck of left femur, initial encounter for closed fracture: Secondary | ICD-10-CM | POA: Diagnosis not present

## 2022-02-04 LAB — CBC
HCT: 38.5 % (ref 36.0–46.0)
Hemoglobin: 13.5 g/dL (ref 12.0–15.0)
MCH: 31.6 pg (ref 26.0–34.0)
MCHC: 35.1 g/dL (ref 30.0–36.0)
MCV: 90.2 fL (ref 80.0–100.0)
Platelets: 236 10*3/uL (ref 150–400)
RBC: 4.27 MIL/uL (ref 3.87–5.11)
RDW: 13.5 % (ref 11.5–15.5)
WBC: 7.1 10*3/uL (ref 4.0–10.5)
nRBC: 0 % (ref 0.0–0.2)

## 2022-02-04 LAB — BASIC METABOLIC PANEL
Anion gap: 12 (ref 5–15)
BUN: 36 mg/dL — ABNORMAL HIGH (ref 8–23)
CO2: 23 mmol/L (ref 22–32)
Calcium: 8.5 mg/dL — ABNORMAL LOW (ref 8.9–10.3)
Chloride: 107 mmol/L (ref 98–111)
Creatinine, Ser: 0.94 mg/dL (ref 0.44–1.00)
GFR, Estimated: >60 mL/min (ref >60)
Glucose, Bld: 92 mg/dL (ref 70–99)
Potassium: 3.4 mmol/L — ABNORMAL LOW (ref 3.5–5.1)
Sodium: 142 mmol/L (ref 135–145)

## 2022-02-04 MED ORDER — POTASSIUM CHLORIDE CRYS ER 20 MEQ PO TBCR
40.0000 meq | EXTENDED_RELEASE_TABLET | Freq: Once | ORAL | Status: AC
  Administered 2022-02-04: 40 meq via ORAL
  Filled 2022-02-04: qty 2

## 2022-02-04 MED ORDER — POTASSIUM CHLORIDE 20 MEQ PO PACK
20.0000 meq | PACK | Freq: Once | ORAL | Status: AC
  Administered 2022-02-04: 20 meq via ORAL
  Filled 2022-02-04: qty 1

## 2022-02-04 NOTE — Discharge Instructions (Signed)

## 2022-02-04 NOTE — Care Management Important Message (Signed)
Important Message  Patient Details  Name: Cassie JASPERS, PhD MRN: [id number] Date of Birth: 1941-05-13   Medicare Important Message Given:  N/A - LOS <3 / Initial given by admissions     Juliann Pulse A Gerda Yin 02/04/2022, 10:16 AM

## 2022-02-04 NOTE — Progress Notes (Signed)
Vitals entered manually ° °

## 2022-02-04 NOTE — TOC Transition Note (Signed)
Transition of Care [facility]) - CM/SW Discharge Note   Patient Details  Name: Cassie HREHA, PhD MRN: [id number] Date of Birth: July 31, 1940  [facility]) CM/SW Contact:  Alberteen Sam, LCSW Phone Number: 02/04/2022, 11:04 AM   Clinical Narrative:     Patient will DC to: [facility] Anticipated DC date: 02/04/22 Family notified:sister Opal Sidles Transport by: Johnanna Schneiders  Per MD patient ready for DC to [facility] . RN, patient, patient's family, and facility notified of DC. Discharge Summary sent to facility. RN given number for report [phone] Room 331. DC packet on chart. Ambulance transport requested for patient.  CSW signing off.  Pricilla Riffle, LCSW    Final next level of care: Skilled Nursing Facility Barriers to Discharge: No Barriers Identified   Patient Goals and CMS Choice Patient states their goals for this hospitalization and ongoing recovery are:: to go home CMS Medicare.gov Compare Post Acute Care list provided to:: Patient Choice offered to / list presented to : Patient  Discharge Placement                Patient to be transferred to facility by: [facility] Name of family member notified: sister jane Patient and family notified of of transfer: 02/04/22  Discharge Plan and Services                                     Social Determinants of Health (SDOH) Interventions     Readmission Risk Interventions     No data to display

## 2022-02-04 NOTE — TOC Initial Note (Signed)
Transition of Care [facility]) - Initial/Assessment Note    Patient Details  Name: Cassie LAROCCA, PhD MRN: [id number] Date of Birth: 31-Oct-1940  Transition of Care [facility]) CM/SW Contact:    Alberteen Sam, LCSW Phone Number: 02/04/2022, 10:39 AM  Clinical Narrative:                  Patient from [facility] ALF, per PT evals recommending SNF. CSW spoke with patient who was at first reluctant to go to [facility], however facility DON Jenny Reichmann called patient as well explained they would be unable to accept patient back to ALF without her going through SNF first. Patient is now agreeable, CSW has updated Joelene Millin at [facility] of potential dc today.   Patient will need [facility] transport at time of discharge.   Expected Discharge Plan: Skilled Nursing Facility Barriers to Discharge: Continued Medical Work up   Patient Goals and CMS Choice Patient states their goals for this hospitalization and ongoing recovery are:: to go home CMS Medicare.gov Compare Post Acute Care list provided to:: Patient Choice offered to / list presented to : Patient  Expected Discharge Plan and Services Expected Discharge Plan: [place]         Expected Discharge Date: 02/04/22                                    Prior Living Arrangements/Services                       Activities of Daily Living Home Assistive Devices/Equipment: Gilford Rile (specify type) ADL Screening (condition at time of admission) Patient's cognitive ability adequate to safely complete daily activities?: No Is the patient deaf or have difficulty hearing?: No Does the patient have difficulty seeing, even when wearing glasses/contacts?: No Does the patient have difficulty concentrating, remembering, or making decisions?: Yes Patient able to express need for assistance with ADLs?: Yes Does the patient have difficulty dressing or bathing?: Yes Independently performs ADLs?: No Communication: Independent Dressing (OT): Needs  assistance Grooming: Independent Feeding: Independent Bathing: Needs assistance Toileting: Needs assistance In/Out Bed: Needs assistance Walks in Home: Needs assistance Does the patient have difficulty walking or climbing stairs?: Yes Weakness of Legs: Left Weakness of Arms/Hands: None  Permission Sought/Granted                  Emotional Assessment              Admission diagnosis:  Fracture of femoral neck, left ([place]) [S72.002A] Closed fracture of neck of left femur, initial encounter ([place]) [S72.002A] Patient Active Problem List   Diagnosis Date Noted   Fracture of femoral neck, left ([place]) 02/01/2022   Dementia arising in the senium and presenium ([place]) 12/06/2019   AMS (altered mental status) 12/01/2019   Atrial fibrillation with RVR ([place]) 12/01/2019   Essential hypertension 12/01/2019   History of breast cancer 12/01/2019   S/P bilateral mastectomy 12/01/2019   Carcinoma of upper-outer quadrant of left breast in female, estrogen receptor positive ([place]) 08/18/2015   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

## 2022-02-04 NOTE — Progress Notes (Signed)
Physical Therapy Treatment Patient Details Name: Cassie GIAMMARCO, PhD MRN: [id number] DOB: March 04, 1941 Today's Date: 02/04/2022   History of Present Illness The patient is a 81 year old female with PMHx significant for atrial fibrillation on eliquis, dementia, breast cancer, and HTN, who sustained a valgus-impacted left femoral neck fracture 3 days ago when she apparently lost her balance and fell. Initially, she did not seek treatment, but because of continued pain with ambulation, she presented to the emergency room yesterday afternoon where x-rays demonstrated the above-noted injury. The patient presented to the emergency room where x-rays demonstrated the above-noted findings. The patient is now s/p in situ cannulated screw fixation of valgus-impacted left femoral neck fracture.    PT Comments    Pt received in bed. Discussed POC, pt aware of possible d/c to SNF and agreeable. Min A for bed mobility and transfers. Pt unable to recall PWB restrictions for L LE. Once standing she appears compliant bed to chair wit repeated vc's and ModA to sit safely in bedside recliner.  Pt completed seated B LE strengthening, all needs placed within reach.    Recommendations for follow up therapy are one component of a multi-disciplinary discharge planning process, led by the attending physician.  Recommendations may be updated based on patient status, additional functional criteria and insurance authorization.  Follow Up Recommendations  Skilled nursing-short term rehab (<3 hours/day) Can patient physically be transported by private vehicle: No   Assistance Recommended at Discharge Intermittent Supervision/Assistance  Patient can return home with the following A lot of help with walking and/or transfers;A little help with bathing/dressing/bathroom;Assistance with cooking/housework;Assist for transportation;Help with stairs or ramp for entrance   Equipment Recommendations   (TBD at next facility)     Recommendations for Other Services       Precautions / Restrictions Precautions Precautions: Fall Restrictions Weight Bearing Restrictions: Yes LLE Weight Bearing: Partial weight bearing LLE Partial Weight Bearing Percentage or Pounds: 50%     Mobility  Bed Mobility Overal bed mobility: Needs Assistance Bed Mobility: Supine to Sit     Supine to sit: Supervision, HOB elevated (use of side rail)     General bed mobility comments: Able to scoot to edge of bed with supervision    Transfers Overall transfer level: Needs assistance Equipment used: Rolling walker (2 wheels) Transfers: Sit to/from Stand Sit to Stand: Min assist, From elevated surface           General transfer comment: vc's for hand placement    Ambulation/Gait Ambulation/Gait assistance: Min assist, Min guard Gait Distance (Feet): 3 Feet Assistive device: Rolling walker (2 wheels) Gait Pattern/deviations: Step-to pattern, Decreased weight shift to left Gait velocity: decreased     General Gait Details: Appears compliant with wt bearing precautions during short distance mobility   Stairs             Wheelchair Mobility    Modified Rankin (Stroke Patients Only)       Balance Overall balance assessment: Needs assistance Sitting-balance support: No upper extremity supported Sitting balance-Leahy Scale: Good     Standing balance support: During functional activity, Reliant on assistive device for balance Standing balance-Leahy Scale: Poor Standing balance comment: use of RW for BUE support                            Cognition Arousal/Alertness: Awake/alert Behavior During Therapy: WFL for tasks assessed/performed Overall Cognitive Status: History of cognitive impairments - at baseline  General Comments: Dementia at baseline, however A&O x3, use of board to determine the date.  Some short term memory deficits noted.         Exercises General Exercises - Lower Extremity Ankle Circles/Pumps: AROM, Strengthening, Both, 10 reps Long Arc Quad: AROM, Strengthening, Both, 10 reps Hip Flexion/Marching: AROM, Strengthening, Both, 10 reps    General Comments General comments (skin integrity, edema, etc.): Pt unable to recall wt bearing restrictions for L LE      Pertinent Vitals/Pain Pain Assessment Pain Assessment: No/denies pain    Home Living                          Prior Function            PT Goals (current goals can now be found in the care plan section) Acute Rehab PT Goals Patient Stated Goal: progress with PT Progress towards PT goals: Progressing toward goals    Frequency    7X/week      PT Plan Current plan remains appropriate    Co-evaluation              AM-PAC PT "6 Clicks" Mobility   Outcome Measure  Help needed turning from your back to your side while in a flat bed without using bedrails?: A Little Help needed moving from lying on your back to sitting on the side of a flat bed without using bedrails?: A Little Help needed moving to and from a bed to a chair (including a wheelchair)?: A Lot Help needed standing up from a chair using your arms (e.g., wheelchair or bedside chair)?: A Little Help needed to walk in hospital room?: Total Help needed climbing 3-5 steps with a railing? : Total 6 Click Score: 13    End of Session Equipment Utilized During Treatment: Gait belt Activity Tolerance: Patient tolerated treatment well Patient left: in chair;with call bell/phone within reach Nurse Communication: Mobility status PT Visit Diagnosis: Unsteadiness on feet (R26.81);Muscle weakness (generalized) (M62.81);History of falling (Z91.81);Difficulty in walking, not elsewhere classified (R26.2)     Time: 1105-1130 PT Time Calculation (min) (ACUTE ONLY): 25 min  Charges:  $Gait Training: 8-22 mins $Therapeutic Exercise: 8-22 mins                    Mikel Cella,  PTA    Josie Dixon 02/04/2022, 11:45 AM

## 2022-02-04 NOTE — Progress Notes (Signed)
Subjective: 2 Days Post-Op Procedure(s) (LRB): CANNULATED HIP PINNIN-Valgus (Left) Patient reports pain as mild.   Patient is well, and has had no acute complaints or problems Denies any CP, SOB, ABD pain. We will continue therapy today. Plan is for discharge to SNF. Patient has had multiple bowel movements since surgery.   Objective: Vital signs in last 24 hours: Temp:  [97.7 F (36.5 C)-98.4 F (36.9 C)] 97.7 F (36.5 C) (07/24 0530) Pulse Rate:  [55-85] 85 (07/24 0530) Resp:  [17-18] 17 (07/24 0530) BP: (127-137)/(75-96) 137/96 (07/24 0530) SpO2:  [94 %-97 %] 97 % (07/24 0530)  Intake/Output from previous day: 07/23 0701 - 07/24 0700 In: 265.2 [P.O.:240; I.V.:25.2] Out: 550 [Urine:550] Intake/Output this shift: No intake/output data recorded.  Recent Labs    02/01/22 1040 02/02/22 0509 02/03/22 0601 02/04/22 0501  HGB 13.9 13.7 13.5 13.5   Recent Labs    02/03/22 0601 02/04/22 0501  WBC 7.0 7.1  RBC 4.54 4.27  HCT 40.1 38.5  PLT 197 236   Recent Labs    02/03/22 0601 02/04/22 0501  NA 139 142  K 3.7 3.4*  CL 106 107  CO2 21* 23  BUN 33* 36*  CREATININE 0.94 0.94  GLUCOSE 130* 92  CALCIUM 8.8* 8.5*   Recent Labs    02/02/22 0509  INR 1.3*    EXAM General - Patient is Alert, Appropriate, and Oriented Extremity - Neurovascular intact Sensation intact distally Intact pulses distally Dorsiflexion/Plantar flexion intact Dressing - dressing C/D/I and no drainage Motor Function - intact, moving foot and toes well on exam.  Patient able to stand with minimal assist Abdomen soft with intact bowel sounds this morning.  Past Medical History:  Diagnosis Date   Atrial fibrillation ([place])    Breast cancer ([place]) 2017   bilateral breast cancer   Hypertension    OFF MEDS X 6 MONTHS DUE TO CONTROLLED BP (08-15-15)   Osteoporosis     Assessment/Plan:   2 Days Post-Op Procedure(s) (LRB): CANNULATED HIP PINNIN-Valgus (Left) Principal Problem:    Fracture of femoral neck, left (H[facility]) Active Problems:   Atrial fibrillation with RVR ([facility])   Essential hypertension   History of breast cancer  Estimated body mass index is 28.25 kg/m as calculated from the following:   Height as of this encounter: '5\' 5"'$  (1.651 m).   Weight as of this encounter: 77 kg. Advance diet Up with therapy, 50% weightbearing left lower extremity   Patient doing well, pain well controlled Good progress of physical therapy.  Current plan is for discharge to SNF. Labs and vital signs are stable. K+ 3.4, will supplement. Patient has had a BM since surgery. Care management to assist with discharge.  Continue with Eliquis at discharge TED hose bilateral lower extremity x6 weeks Follow-up with [facility] orthopedics in 2 weeks for x-rays of the left hip.  DVT Prophylaxis - TED hose and Eliquis SCDs 50% weightbearing left lower extremity   J. Cameron Proud, PA-C [place] 02/04/2022, 7:54 AM

## 2022-02-04 NOTE — Discharge Summary (Signed)
Cassie Starch, PhD JTT:[id number] DOB: 03-02-1941 DOA: 02/01/2022  PCP: Kirk Ruths, Cassie Lopez  Admit date: 02/01/2022 Discharge date: 02/04/2022  Time spent: 35 minutes  Recommendations for Outpatient Follow-up:  Orthopedics f/u 2 weeks     Discharge Diagnoses:  Principal Problem:   Fracture of femoral neck, left ([place]) Active Problems:   Atrial fibrillation with RVR ([place])   Essential hypertension   History of breast cancer   Osteoporosis   Dementia arising in the senium and presenium ([place])   CKD (chronic kidney disease) stage 2, GFR 60-89 ml/min   Discharge Condition: stable  Diet recommendation: heart healthy  Filed Weights   02/02/22 0500  Weight: 77 kg    History of present illness:  From admission h and p Cassie Starch, PhD is a 81 y.o. female with medical history significant for permanent atrial fibrillation on Eliquis, dementia, breast cancer on letrozole, hypertension, who presented to [facility] from assisted living facility after a mechanical fall at home.  Her fall occurred yesterday, on 01/31/2022 AM.  Since then, the patient has been unable to put any weight on her left lower extremity.  Due to persistent pain in her left hip she presented to the ED today for further evaluation.  Imaging reveals left femoral neck fracture.  Last dose of Eliquis was yesterday evening.  Hospital Course:  # Left femoral neck fracture S/p operative repair today w/ dr. Roland Rack. Hgb wnl. - home apixaban resumed - pain controlled, moving bowels, tolerating diet - SNF - 50% weight bearing LLE   # Hx breast cancer - cont home letrozole   # Ckd stage 2 stable   # Chronic a fib Rate controlled - cont home dilt - apixaban     # Dementia Lives in assisted living, [facility]   # MDD - cont home mirtazapine, quetiapine   # HFpEF Euvolemic, no longer on lasix  Procedures: See above   Consultations: orthopedics  Discharge Exam: Vitals:   02/04/22 0530 02/04/22 0801  BP: (!)  137/96 125/80  Pulse: 85 (!) 51  Resp: 17   Temp: 97.7 F (36.5 C) 97.9 F (36.6 C)  SpO2: 97% 98%    General exam: Appears calm and comfortable  Respiratory system: Clear to auscultation. Respiratory effort normal. Cardiovascular system: S1 & S2 heard, irreg, mild tachy, soft systolic murmur Gastrointestinal system: Abdomen is nondistended, soft and nontender. No organomegaly or masses felt. Normal bowel sounds heard. Central nervous system: Alert and oriented. No focal neurological deficits. Extremities: Symmetric 5 x 5 power. Skin: left lateral hip dressing c/d/i Psychiatry:  calm  Discharge Instructions   Discharge Instructions     Diet - low sodium heart healthy   Complete by: As directed    Increase activity slowly   Complete by: As directed    Leave dressing on - Keep it clean, dry, and intact until clinic visit   Complete by: As directed       Allergies as of 02/04/2022       Reactions   Bactericin [bacitracin] Rash   bactrim        Medication List     STOP taking these medications    aspirin 81 MG tablet   Calcium 600 600 MG Tabs tablet Generic drug: calcium carbonate   furosemide 20 MG tablet Commonly known as: LASIX   Selenium 200 MCG Caps       TAKE these medications    acetaminophen 325 MG tablet Commonly known as: TYLENOL Take 2 tablets (  650 mg total) by mouth every 6 (six) hours as needed for mild pain, fever or headache.   apixaban 5 MG Tabs tablet Commonly known as: ELIQUIS Take 1 tablet (5 mg total) by mouth 2 (two) times daily.   diltiazem 240 MG 24 hr capsule Commonly known as: CARDIZEM CD Take 1 capsule (240 mg total) by mouth daily.   Fish Oil 1000 MG Caps Take 1,000 mg by mouth daily.   letrozole 2.5 MG tablet Commonly known as: FEMARA Take 1 tablet (2.5 mg total) by mouth daily.   mirtazapine 15 MG tablet Commonly known as: REMERON Take 15 mg by mouth at bedtime.   oxyCODONE 5 MG immediate release  tablet Commonly known as: Oxy IR/ROXICODONE Take 0.5-1 tablets (2.5-5 mg total) by mouth every 4 (four) hours as needed for moderate pain or breakthrough pain.   QUEtiapine 25 MG tablet Commonly known as: SEROQUEL Take 25 mg by mouth at bedtime.   Super Quints B-50 Tabs Take by mouth.   Vitamin D3 25 MCG (1000 UT) Caps Take 2,000 Units by mouth daily.               Discharge Care Instructions  (From admission, onward)           Start     Ordered   02/04/22 0000  Leave dressing on - Keep it clean, dry, and intact until clinic visit        02/04/22 1039           Allergies  Allergen Reactions   Bactericin [Bacitracin] Rash    bactrim    Follow-up Information     Lattie Corns, PA-C Follow up in 2 week(s).   Specialty: Physician Assistant Contact information: [place] [address] [phone]                  The results of significant diagnostics from this hospitalization (including imaging, microbiology, ancillary and laboratory) are listed below for reference.    Significant Diagnostic Studies: DG HIP UNILAT WITH PELVIS 2-3 VIEWS LEFT  Result Date: 02/02/2022 CLINICAL DATA:  Left hip pinning. EXAM: OPERATIVE left HIP (WITH PELVIS IF PERFORMED) 11 VIEWS TECHNIQUE: Fluoroscopic spot image(s) were submitted for interpretation post-operatively. Radiation Exposure Index (as provided by the fluoroscopic device): Dose area product 1.36 Gy*cm2 COMPARISON:  Left hip radiographs 02/01/2022 FINDINGS: Left femoral neck fracture again noted. Intraoperative images demonstrate 3 screws across the fracture. IMPRESSION: ORIF of the left femoral neck fracture. Electronically Signed   By: San Morelle M.D.   On: 02/02/2022 08:52   DG C-Arm 1-60 Min-No Report  Result Date: 02/02/2022 Fluoroscopy was utilized by the requesting physician.  No radiographic interpretation.   CT Head Wo Contrast  Result Date: 02/01/2022 CLINICAL  DATA:  Head trauma, minor (Age >= 65y); Neck trauma (Age >= 65y) EXAM: CT HEAD WITHOUT CONTRAST CT CERVICAL SPINE WITHOUT CONTRAST TECHNIQUE: Multidetector CT imaging of the head and cervical spine was performed following the standard protocol without intravenous contrast. Multiplanar CT image reconstructions of the cervical spine were also generated. RADIATION DOSE REDUCTION: This exam was performed according to the departmental dose-optimization program which includes automated exposure control, adjustment of the mA and/or kV according to patient size and/or use of iterative reconstruction technique. COMPARISON:  None Available. FINDINGS: CT HEAD FINDINGS Brain: No evidence of acute infarction, hemorrhage, hydrocephalus, extra-axial collection or mass lesion/mass effect. Please note a small portion of the most superficial right cerebral convexity is not imaged  due to patient positioning. Vascular: No hyperdense vessel identified. Skull: No evidence of acute fracture. Chronic sclerotic right calvarial lesion, probably benign. Sinuses/Orbits: Largely clear sinuses.  No acute orbital findings. Other: No mastoid effusions. CT CERVICAL SPINE FINDINGS Alignment: No substantial sagittal subluxation. Skull base and vertebrae: Vertebral body heights are maintained. No evidence of acute fracture. Soft tissues and spinal canal: No prevertebral fluid or swelling. No visible canal hematoma. Disc levels: Multilevel degenerative disease, greatest at C4-C5 and C5-C6. Upper chest: Visualized lung apices are clear. Other: Large (3.4 cm) heterogeneous mass, arising from or adjacent to the left thyroid gland. IMPRESSION: 1. No evidence of acute intracranial abnormality. 2. No evidence of acute fracture or traumatic malalignment in the cervical spine. 3. Large (3.4 cm) heterogeneous mass arising from or adjacent to the left thyroid gland. Recommend thyroid ultrasound (ref: J Am Coll Radiol. 2015 Feb;12(2): 143-50). Electronically  Signed   By: Margaretha Sheffield M.D.   On: 02/01/2022 09:47   CT Cervical Spine Wo Contrast  Result Date: 02/01/2022 CLINICAL DATA:  Head trauma, minor (Age >= 65y); Neck trauma (Age >= 65y) EXAM: CT HEAD WITHOUT CONTRAST CT CERVICAL SPINE WITHOUT CONTRAST TECHNIQUE: Multidetector CT imaging of the head and cervical spine was performed following the standard protocol without intravenous contrast. Multiplanar CT image reconstructions of the cervical spine were also generated. RADIATION DOSE REDUCTION: This exam was performed according to the departmental dose-optimization program which includes automated exposure control, adjustment of the mA and/or kV according to patient size and/or use of iterative reconstruction technique. COMPARISON:  None Available. FINDINGS: CT HEAD FINDINGS Brain: No evidence of acute infarction, hemorrhage, hydrocephalus, extra-axial collection or mass lesion/mass effect. Please note a small portion of the most superficial right cerebral convexity is not imaged due to patient positioning. Vascular: No hyperdense vessel identified. Skull: No evidence of acute fracture. Chronic sclerotic right calvarial lesion, probably benign. Sinuses/Orbits: Largely clear sinuses.  No acute orbital findings. Other: No mastoid effusions. CT CERVICAL SPINE FINDINGS Alignment: No substantial sagittal subluxation. Skull base and vertebrae: Vertebral body heights are maintained. No evidence of acute fracture. Soft tissues and spinal canal: No prevertebral fluid or swelling. No visible canal hematoma. Disc levels: Multilevel degenerative disease, greatest at C4-C5 and C5-C6. Upper chest: Visualized lung apices are clear. Other: Large (3.4 cm) heterogeneous mass, arising from or adjacent to the left thyroid gland. IMPRESSION: 1. No evidence of acute intracranial abnormality. 2. No evidence of acute fracture or traumatic malalignment in the cervical spine. 3. Large (3.4 cm) heterogeneous mass arising from or  adjacent to the left thyroid gland. Recommend thyroid ultrasound (ref: J Am Coll Radiol. 2015 Feb;12(2): 143-50). Electronically Signed   By: Margaretha Sheffield M.D.   On: 02/01/2022 09:47   DG Hip Unilat W or Wo Pelvis 2-3 Views Left  Result Date: 02/01/2022 CLINICAL DATA:  Left hip pain after fall EXAM: DG HIP (WITH OR WITHOUT PELVIS) 2-3V LEFT COMPARISON:  None Available. FINDINGS: There is an impacted fracture of the left femoral neck with a cortical break seen along the medial aspect of the head/neck junction. Femoroacetabular alignment is maintained. No other fracture is seen. Right hip alignment is normal. The SI joints and symphysis pubis are intact. IMPRESSION: Impacted fracture of the left femoral neck. Electronically Signed   By: Valetta Mole M.D.   On: 02/01/2022 09:26   DG Chest 1 View  Result Date: 02/01/2022 CLINICAL DATA:  Fall from standing EXAM: CHEST  1 VIEW COMPARISON:  PET-CT 01/10/2020 FINDINGS: Cardiomediastinal  silhouette and pulmonary vasculature are within normal limits. Lungs are clear. IMPRESSION: No acute cardiopulmonary process. Electronically Signed   By: Miachel Roux M.D.   On: 02/01/2022 09:23   DG Knee 2 Views Left  Result Date: 02/01/2022 CLINICAL DATA:  Left hip and knee pain status post fall EXAM: LEFT KNEE - 1-2 VIEW COMPARISON:  None available FINDINGS: No fracture or dislocation. Soft tissues are normal. Mild diffuse osteopenia. IMPRESSION: No acute abnormality of the left knee. Electronically Signed   By: Miachel Roux M.D.   On: 02/01/2022 09:22    Microbiology: Recent Results (from the past 240 hour(s))  Surgical PCR screen     Status: None   Collection Time: 02/01/22  6:20 PM   Specimen: Nasal Mucosa; Nasal Swab  Result Value Ref Range Status   MRSA, PCR NEGATIVE NEGATIVE Final   Staphylococcus aureus NEGATIVE NEGATIVE Final    Comment: (NOTE) The Xpert SA Assay (FDA approved for NASAL specimens in patients 18 years of age and older), is one component  of a comprehensive surveillance program. It is not intended to diagnose infection nor to guide or monitor treatment. Performed at [facility], [address] [place], [place] [address]      Labs: Basic Metabolic Panel: Recent Labs  Lab 02/01/22 1040 02/02/22 0509 02/03/22 0601 02/04/22 0501  NA 141 140 139 142  K 3.0* 3.6 3.7 3.4*  CL 106 108 106 107  CO2 26 21* 21* 23  GLUCOSE 110* 100* 130* 92  BUN 21 29* 33* 36*  CREATININE 1.12* 1.12* 0.94 0.94  CALCIUM 8.8* 8.7* 8.8* 8.5*  MG  --  2.0  --   --   PHOS  --  3.5  --   --    Liver Function Tests: No results for input(s): "AST", "ALT", "ALKPHOS", "BILITOT", "PROT", "ALBUMIN" in the last 168 hours. No results for input(s): "LIPASE", "AMYLASE" in the last 168 hours. No results for input(s): "AMMONIA" in the last 168 hours. CBC: Recent Labs  Lab 02/01/22 1040 02/02/22 0509 02/03/22 0601 02/04/22 0501  WBC 7.4 6.3 7.0 7.1  NEUTROABS 5.9  --   --   --   HGB 13.9 13.7 13.5 13.5  HCT 41.9 41.2 40.1 38.5  MCV 91.1 90.0 88.3 90.2  PLT 199 202 197 236   Cardiac Enzymes: No results for input(s): "CKTOTAL", "CKMB", "CKMBINDEX", "TROPONINI" in the last 168 hours. BNP: BNP (last 3 results) No results for input(s): "BNP" in the last 8760 hours.  ProBNP (last 3 results) No results for input(s): "PROBNP" in the last 8760 hours.  CBG: No results for input(s): "GLUCAP" in the last 168 hours.     Signed:  Desma Maxim Cassie Lopez.  [facility] 02/04/2022, 10:42 AM

## 2022-02-06 NOTE — Progress Notes (Signed)
Discharge note:  Patient left via EMS with all valuables and discharge paperwork. Patient transported to SNF facility.

## 2022-03-12 ENCOUNTER — Non-Acute Institutional Stay: Payer: Medicare Other | Admitting: Student

## 2022-03-12 DIAGNOSIS — Z515 Encounter for palliative care: Secondary | ICD-10-CM

## 2022-03-12 DIAGNOSIS — E43 Unspecified severe protein-calorie malnutrition: Secondary | ICD-10-CM

## 2022-03-12 DIAGNOSIS — F03918 Unspecified dementia, unspecified severity, with other behavioral disturbance: Secondary | ICD-10-CM

## 2022-03-12 DIAGNOSIS — R634 Abnormal weight loss: Secondary | ICD-10-CM

## 2022-03-12 NOTE — Progress Notes (Signed)
[facility] Pall[facility] Care Consult Note Telephone: [phone]  Fax: [phone]   Date of encounter: 03/12/22  PATIENT NAME: Cassie Starch, PhD [address] [place] [id number]   [phone] (home)  DOB: 1941/05/26 MRN: [id number] PRIMARY CARE PROVIDER:    Kirk Ruths, MD,  [address] [place] [address] [phone]  REFERRING PROVIDER:   Kirk Ruths, MD [facility] [place],   [address] [phone]  RESPONSIBLE PARTY:    Contact Information     Name Relation Home Work [place] Sister [phone]  [phone]        I met face to face with patient in the facility. Palliative Care was asked to follow this patient by consultation request of  Kirk Ruths, MD to address advance care planning and complex medical decision making. This is the initial visit.                                     ASSESSMENT AND PLAN / RECOMMENDATIONS:   Advance Care Planning/Goals of Care: Goals include to maximize quality of life and symptom management. Patient/health care surrogate gave his/her permission to discuss.Our advance care planning conversation included a discussion about:    The value and importance of advance care planning  Experiences with loved ones who have been seriously ill or have died  Exploration of personal, cultural or spiritual beliefs that might influence medical decisions  Exploration of goals of care in the event of a sudden injury or illness  CODE STATUS: DNR  Education provided on Palliative Medicine. Will continue to provide supportive care, symptom management.   Symptom Management/Plan:  Dementia with behavioral disturbance- patient requires reorientation, redirection as needed. She will be staying on skilled [facility] unit. She does require assistance with adl's; she is resistant to care at times.  Monitor for falls/safety. Continue Seroquel 25 mg QHS.   Weight loss, protein calorie malnutrition- patient's current weight 133.7 pounds. Her appetite is poor overall. She is declining nutritional supplements. Encourage foods patient enjoys, recommend eating in dining room. Continue mirtazapine 15 mg QHS.   Generalized weakness-patient has declined therapy. She requires assist x 1 usually for transfers. Staff to continue assisting with adl's, monitor for falls/safety.   Follow up Palliative Care Visit: Palliative care will continue to follow for complex medical decision making, advance care planning, and clarification of goals. Return in 8 weeks or prn.  This visit was coded based on medical decision making (MDM).  PPS: 40%  HOSPICE ELIGIBILITY/DIAGNOSIS: TBD  Chief Complaint: Palliative Medicine initial consult.   HISTORY OF PRESENT ILLNESS:  Cassie Starch, PhD is a 81 y.o. year old female  with hypertension, diastolic heart failure, permanent atrial fibrillation, dementia, CKD 2, depression, hx of breast CA on letrozole. Patient hospitalized 7/21-7/24/23 s/p mechanical fall sustained femoral neck fracture  with repair.   Patient currently at [facility] on [facility] skilled unit. She is no longer receiving therapy; she declined to continue services. Staff report she will decline to participate or refuse care. She states her appetite is okay, while staff report poor appetite. She would not eat magic cup and is now refusing mighty shakes. She has started going to dining room for meals. Patient states she enjoyed this and staff report she did eat better. She denies pain, shortness  of breath, nausea, constipation.   Patient received resting in bed. She welcomes visit. She denies having a fall; she states she was recently hospitalized due to her left foot. Patient talks about not participating in therapy as she "does not like that group." She also talks about not returning to her previous apartment at one  point, but later in conversation mentions she does not know when she will be returning to her previous room. 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 She does repeat herself at times during visit. Patient denies any needs at this time.   History obtained from review of EMR, discussion with primary team, and interview with family, facility staff/caregiver and/or [name].  I reviewed available labs, medications, imaging, studies and related documents from the EMR.  Records reviewed and summarized above.   ROS  A 10 Point ROS is negative, except for the pertinent positives/ negatives detailed per the HPI.   Physical Exam: Weight: 133.7 pounds 02/27/22 Constitutional: NAD General: frail appearing EYES: anicteric sclera, lids intact, no discharge  ENMT: intact hearing, oral mucous membranes moist, dentition intact CV: S1S2, RRR, trace LE edema Pulmonary: LCTA, no increased work of breathing, no cough, room air Abdomen: normo-active BS + 4 quadrants, soft and non tender GU: deferred MSK: + sarcopenia, moves all extremities Skin: warm and dry, no rashes or wounds on visible skin Neuro: + generalized weakness, A & O x 2 Psych: non-anxious affect, cooperative Hem/lymph/immuno: no widespread bruising CURRENT PROBLEM LIST:  Patient Active Problem List   Diagnosis Date Noted   CKD (chronic kidney disease) stage 2, GFR 60-89 ml/min 02/04/2022   Fracture of femoral neck, left ([place]) 02/01/2022   Dementia arising in the senium and presenium ([place]) 12/06/2019   AMS (altered mental status) 12/01/2019   Atrial fibrillation with RVR ([place]) 12/01/2019   Essential hypertension 12/01/2019   History of breast cancer 12/01/2019   S/P bilateral mastectomy 12/01/2019   Carcinoma of upper-outer quadrant of left breast in female, estrogen receptor positive ([place]) 08/18/2015   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   Osteoporosis 11/22/2011   PAST  MEDICAL HISTORY:  Active Ambulatory Problems    Diagnosis Date Noted   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   Carcinoma of upper-outer quadrant of left breast in female, estrogen receptor positive ([place]) 08/18/2015   AMS (altered mental status) 12/01/2019   Atrial fibrillation with RVR ([place]) 12/01/2019   Essential hypertension 12/01/2019   History of breast cancer 12/01/2019   S/P bilateral mastectomy 12/01/2019   Fracture of femoral neck, left ([place]) 02/01/2022   Osteoporosis 11/22/2011   Dementia arising in the senium and presenium ([place]) 12/06/2019   CKD (chronic kidney disease) stage 2, GFR 60-89 ml/min 02/04/2022   Resolved Ambulatory Problems    Diagnosis Date Noted   Breast mass, left 08/12/2015   Left breast mass 08/15/2015   Breast cancer ([place]) 08/24/2015   DCIS (ductal carcinoma in situ) 09/06/2015   Breast cancer, stage 2 ([place]) 09/29/2015   Past Medical History:  Diagnosis Date   Atrial fibrillation (HCC)    Hypertension    SOCIAL HX:  Social History   Tobacco Use   Smoking status: Never   Smokeless tobacco: Never  Substance Use Topics   Alcohol use: No    Alcohol/week: 0.0 standard drinks of alcohol   FAMILY HX:  Family History  Problem Relation Age of Onset   Cancer Neg Hx  ALLERGIES:  Allergies  Allergen Reactions   Bactericin [Bacitracin] Rash    bactrim     PERTINENT MEDICATIONS:  Outpatient Encounter Medications as of 03/12/2022  Medication Sig   acetaminophen (TYLENOL) 325 MG tablet Take 2 tablets (650 mg total) by mouth every 6 (six) hours as needed for mild pain, fever or headache.   apixaban (ELIQUIS) 5 MG TABS tablet Take 1 tablet (5 mg total) by mouth 2 (two) times daily.   B Complex-Biotin-FA (SUPER QUINTS B-50) TABS Take by mouth.   Cholecalciferol (VITAMIN D3) 1000 units CAPS Take 2,000 Units by mouth daily.    diltiazem (CARDIZEM CD) 240 MG 24 hr capsule Take 1 capsule (240 mg total) by mouth daily.   letrozole  (FEMARA) 2.5 MG tablet Take 1 tablet (2.5 mg total) by mouth daily.   mirtazapine (REMERON) 15 MG tablet Take 15 mg by mouth at bedtime.   Omega-3 Fatty Acids (FISH OIL) 1000 MG CAPS Take 1,000 mg by mouth daily.    oxyCODONE (OXY IR/ROXICODONE) 5 MG immediate release tablet Take 0.5-1 tablets (2.5-5 mg total) by mouth every 4 (four) hours as needed for moderate pain or breakthrough pain.   QUEtiapine (SEROQUEL) 25 MG tablet Take 25 mg by mouth at bedtime.   No facility-administered encounter medications on file as of 03/12/2022.   Thank you for the opportunity to participate in the care of [name].  The palliative care team will continue to follow. Please call our office at [phone] if we can be of additional assistance.   Ezekiel Slocumb, NP   COVID-19 PATIENT SCREENING TOOL Asked and negative response unless otherwise noted:  Have you had symptoms of covid, tested positive or been in contact with someone with symptoms/positive test in the past 5-10 days? No

## 2022-03-13 ENCOUNTER — Other Ambulatory Visit: Payer: Self-pay | Admitting: Internal Medicine

## 2022-03-13 ENCOUNTER — Telehealth: Payer: Self-pay | Admitting: Student

## 2022-03-13 DIAGNOSIS — E079 Disorder of thyroid, unspecified: Secondary | ICD-10-CM

## 2022-03-13 NOTE — Telephone Encounter (Signed)
Palliative NP spoke with patient's sister regarding palliative consult. Reviewed goals of care. Patient to remain on [facility] unit and they cleared out her AL apartment. Patient is a DNR, open to hospitalization if cannot be managed at facility. Palliative will continue to provide ongoing support, monitor for changes/declines. Monitoring her weight as she has had recent weight loss. Family is encouraged to call as needs/concerns arise.

## 2022-03-15 ENCOUNTER — Other Ambulatory Visit: Payer: Medicare Other

## 2022-03-21 ENCOUNTER — Ambulatory Visit
Admission: RE | Admit: 2022-03-21 | Discharge: 2022-03-21 | Disposition: A | Discharge status: Home / Self Care | Payer: Medicare Other | Source: Ambulatory Visit | Attending: Internal Medicine | Admitting: Internal Medicine

## 2022-03-21 DIAGNOSIS — E079 Disorder of thyroid, unspecified: Secondary | ICD-10-CM

## 2022-03-22 ENCOUNTER — Other Ambulatory Visit: Payer: Self-pay | Admitting: Internal Medicine

## 2022-03-25 ENCOUNTER — Other Ambulatory Visit: Payer: Self-pay | Admitting: Internal Medicine

## 2022-04-10 ENCOUNTER — Non-Acute Institutional Stay: Payer: Medicare Other | Admitting: Student

## 2022-04-10 DIAGNOSIS — E46 Unspecified protein-calorie malnutrition: Secondary | ICD-10-CM

## 2022-04-10 DIAGNOSIS — F03918 Unspecified dementia, unspecified severity, with other behavioral disturbance: Secondary | ICD-10-CM

## 2022-04-10 DIAGNOSIS — Z515 Encounter for palliative care: Secondary | ICD-10-CM

## 2022-04-10 NOTE — Progress Notes (Signed)
[facility] Consult Note Telephone: [phone]  Fax: [phone]    Date of encounter: 04/10/22  PATIENT NAME: Cassie Starch, PhD [address] [place] [address]   [phone] (home)  DOB: 03-23-1941 MRN: [id number] PRIMARY CARE PROVIDER:    Kirk Ruths, MD,  [address] [place] [address] [phone]  REFERRING PROVIDER:   Kirk Ruths, MD [facility] [place],  [place] [address] [phone]  RESPONSIBLE PARTY:    Contact Information     Name Relation Home Work [place] Sister [phone]  [phone]        I met face to face with patient in the facility. Palliative Care was asked to follow this patient by consultation request of  Cassie Ruths, MD to address advance care planning and complex medical decision making. This is a follow up visit.                                   ASSESSMENT AND PLAN / RECOMMENDATIONS:   Advance Care Planning/Goals of Care: Goals include to maximize quality of life and symptom management. Patient/health care surrogate gave his/her permission to discuss. CODE STATUS: DNR  Education provided on Palliative medicine. Will continue to provide supportive care, symptom management. Will monitor for changes/declines.  Symptom Management/Plan:  Dementia with behavioral disturbance- patient requires reorientation, redirection as needed. Staff to continue assisting with adl's. She can be resistant to care at times. Monitor for falls/safety. Continue Seroquel 25 mg QHS.    Weight loss, protein calorie malnutrition- patient's current weight 146. 3 pounds. Her appetite has improved. Encourage foods patient enjoys, continue eating in dining room. Continue mirtazapine 15 mg QHS. Routine weights per facility.  Follow up Palliative Care Visit: Palliative care will continue to follow  for complex medical decision making, advance care planning, and clarification of goals. Return in 6-8 weeks or prn.  This visit was coded based on medical decision making (MDM).  PPS: 40%  HOSPICE ELIGIBILITY/DIAGNOSIS: TBD  Chief Complaint: Palliative Medicine follow up visit.   HISTORY OF PRESENT ILLNESS:  Cassie Starch, PhD is a 81 y.o. year old female  with hypertension, diastolic heart failure, permanent atrial fibrillation, dementia, CKD 2, depression, thyroid nodule, hx of breast CA on letrozole. Patient hospitalized 7/21-7/24/23 s/p mechanical fall sustained femoral neck fracture  with repair.   Patient has been stable per staff. Patient reports doing well. She denies pain. She does not recall fall resulting in hip fracture. No recent falls reported. She endorses a good appetite; staff reports improvement in appetite. She will ask for extra portions. She is going to dining room for meals.   History obtained from review of EMR, discussion with primary team, and interview with family, facility staff/caregiver and/or Ms. Cassie Lopez.  I reviewed available labs, medications, imaging, studies and related documents from the EMR.  Records reviewed and summarized above.   ROS  A 10 Point ROS is negative, except for the pertinent positives/ negatives detailed per the HPI.   Physical Exam: Weight: 146.3 pounds Constitutional: NAD General: frail appearing EYES: anicteric sclera, lids intact, no discharge  ENMT: intact hearing, oral mucous membranes moist, dentition intact CV: S1S2, RRR, no LE edema Pulmonary: LCTA, no increased work of breathing, no cough, room air Abdomen: normo-active BS + 4 quadrants, soft  and non tender GU: deferred MSK: moves all extremities Skin: warm and dry, no rashes or wounds on visible skin Neuro: +generalized weakness, A & O x 2 Psych: non-anxious affect, cooperative Hem/lymph/immuno: no widespread bruising   Thank you for the opportunity to participate in  the care of Ms. Cassie Lopez.  The palliative care team will continue to follow. Please call our office at [phone] if we can be of additional assistance.   Cassie Slocumb, NP   COVID-19 PATIENT SCREENING TOOL Asked and negative response unless otherwise noted:   Have you had symptoms of covid, tested positive or been in contact with someone with symptoms/positive test in the past 5-10 days? No

## 2022-07-02 ENCOUNTER — Non-Acute Institutional Stay: Payer: Medicare Other | Admitting: Nurse Practitioner

## 2022-07-02 DIAGNOSIS — E46 Unspecified protein-calorie malnutrition: Secondary | ICD-10-CM

## 2022-07-02 DIAGNOSIS — F03918 Unspecified dementia, unspecified severity, with other behavioral disturbance: Secondary | ICD-10-CM

## 2022-07-02 DIAGNOSIS — Z515 Encounter for palliative care: Secondary | ICD-10-CM

## 2022-07-03 ENCOUNTER — Encounter: Payer: Self-pay | Admitting: Nurse Practitioner

## 2022-07-03 NOTE — Progress Notes (Addendum)
  [facility] Consult Note Telephone: [phone]  Fax: [phone]    Date of encounter: 07/03/22 8:32 AM PATIENT NAME: Cassie L Dimascio, PhD [address] [place] [address]   [phone] (home)  DOB: 09/11/1940 MRN: [id number] PRIMARY CARE PROVIDER:   [facility] Anderson, Marshall W, MD,  [address] [facility] [place] [address] [phone]  RESPONSIBLE PARTY:    Contact Information     Name Relation Home Work Mobile   Cassie Lopez,Cassie Lopez Sister [phone]  [phone]     I met face to face with patient in the facility. Palliative Care was asked to follow this patient by consultation request of  Anderson, Marshall W, MD to address advance care planning and complex medical decision making. This is a follow up visit.                                  ASSESSMENT AND PLAN / RECOMMENDATIONS:  Symptom Management/Plan:  1. Advance Care Planning;  DNR 2. Goals of Care: Goals include to maximize quality of life and symptom management. Our advance care planning conversation included a discussion about:    The value and importance of advance care planning  Exploration of personal, cultural or spiritual beliefs that might influence medical decisions  Exploration of goals of care in the event of a sudden injury or illness  Identification and preparation of a healthcare agent  Review and updating or creation of an advance directive document. 3. Palliative care encounter; Palliative care encounter; Palliative medicine team will continue to support patient, patient's family, and medical team. Visit consisted of counseling and education dealing with the complex and emotionally intense issues of symptom management and palliative care in the setting of serious and potentially life-threatening illness 4. Dementia with behavioral disturbance- progressive, supportive role, encourage socialization, appetite,  coping strategies   5. Weight loss, protein calorie malnutrition- stable, continue to encourage Cassie Lopez to eat, snacks, supplements, reviewed weights, continue to weigh. Routine labs 06/14/2022 weight 160.6 lbs   Follow up Palliative Care Visit: Palliative care will continue to follow for complex medical decision making, advance care planning, and clarification of goals. Return in 4-8 weeks or prn. PPS: 40% Chief Complaint: Follow up palliative consult for complex medical decision making, address goals, manage ongoing symptoms I spent 45 minutes providing this consultation. More than 50% of the time in this consultation was spent in counseling and care coordination.    HISTORY OF PRESENT ILLNESS:  Cassie L Ponds, PhD is a 81 y.o. year old female  with hypertension, diastolic heart failure, permanent atrial fibrillation, dementia, CKD 2, depression, thyroid nodule, hx of breast CA on letrozole. Patient hospitalized 7/21-7/24/23 s/p mechanical fall sustained femoral neck fracture  with repair. Cassie Lopez resides LTC at [facility]. Cassie Lopez is able to maneuver for transfers, requires assistance for ADL's bathing, dressing, does feed herself with fair appetite per staff. Staff endorses no new changes including no recent falls, wounds, infections, hospitalizations. At present Cassie Lopez is sitting in the chair in her room. We talked about purpose of pc visit, ros, her functional abilities. We talked about her daily routine, things she likes to do. We talked about quality of life, appetite. We talked about medical goals, medications reviewed, poc, will continue to follow, monitor. Supportive role today, no new changes recommended. Emotional support provided.      History obtained from review of EMR, discussion with primary team, and interview with family, facility staff/caregiver and/or Cassie Lopez.  I reviewed available labs, medications, imaging, studies and related documents from the EMR.  Records  reviewed and summarized above.    ROS  A 10 Point ROS is negative, except for the pertinent positives/ negatives detailed per the HPI.  Physical: General: frail appearing ENMT: oral mucous membranes moist CV: no LE edema Pulmonary: LCTA, no increased work of breathing, no cough, room air Abdomen: soft and non tender MSK: moves all extremities Skin: warm and dry Neuro: +generalized weakness, A & O x 2 Psych: non-anxious affect, cooperative  Thank you for the opportunity to participate in the care of Cassie Lopez. Please call our office at [phone] if we can be of additional assistance.   Madelene Kaatz Ihor Gully, NP

## 2022-08-28 ENCOUNTER — Non-Acute Institutional Stay: Payer: Medicare Other | Admitting: Nurse Practitioner

## 2022-08-28 ENCOUNTER — Encounter: Payer: Self-pay | Admitting: Nurse Practitioner

## 2022-08-28 DIAGNOSIS — Z515 Encounter for palliative care: Secondary | ICD-10-CM

## 2022-08-28 DIAGNOSIS — F03918 Unspecified dementia, unspecified severity, with other behavioral disturbance: Secondary | ICD-10-CM

## 2022-08-28 DIAGNOSIS — E46 Unspecified protein-calorie malnutrition: Secondary | ICD-10-CM

## 2022-08-28 NOTE — Progress Notes (Signed)
[facility] Consult Note Telephone: [phone]  Fax: [phone]    Date of encounter: 08/28/22 2:49 PM PATIENT NAME: Cassie Starch, PhD [address] [place] [address]   [phone] (home)  DOB: 01/21/1941 MRN: [id number] PRIMARY CARE PROVIDER:    Kirk Ruths, MD,  [facility]:    Contact Information     Name Relation Home Work [place] Sister [phone]  [phone]     I met face to face with patient in the facility. Palliative Care was asked to follow this patient by consultation request of  Kirk Ruths, MD to address advance care planning and complex medical decision making. This is a follow up visit.                                  ASSESSMENT AND PLAN / RECOMMENDATIONS:  Symptom Management/Plan:  1. Advance Care Planning;  DNR 2. Goals of Care: Goals include to maximize quality of life and symptom management. Our advance care planning conversation included a discussion about:    The value and importance of advance care planning  Exploration of personal, cultural or spiritual beliefs that might influence medical decisions  Exploration of goals of care in the event of a sudden injury or illness  Identification and preparation of a healthcare agent  Review and updating or creation of an advance directive document. 3. Palliative care encounter; Palliative care encounter; Palliative medicine team will continue to support patient, patient's family, and medical team. Visit consisted of counseling and education dealing with the complex and emotionally intense issues of symptom management and palliative care in the setting of serious and potentially life-threatening illness 4. Dementia with behavioral disturbance- stable, supportive role, encourage socialization, appetite, coping strategies   5. protein calorie malnutrition- stable, weight gain;  continue to encourage Cassie Lopez to eat, snacks, supplements, reviewed weights, continue to weigh. Routine labs 06/14/2022 weight 160.6 lbs 08/15/2022 weight 165.4 lbs  Follow up Palliative Care Visit: PC f/u visit further discussion monitor trends of appetite, weights, monitor for functional, cognitive decline with chronic disease progression, assess any active symptoms, supportive role.Palliative care will continue to follow for complex medical decision making, advance care planning, and clarification of goals. Return in 4-8 weeks or prn. PPS: 40% Chief Complaint: Follow up palliative consult for complex medical decision making, address goals, manage ongoing symptoms I spent 35 minutes providing this consultation starting at 12:45 am. More than 50% of the time in this consultation was spent in counseling and care coordination.    HISTORY OF PRESENT ILLNESS:  Cassie Starch, PhD is a 82 y.o. year old female  with hypertension, diastolic heart failure, permanent atrial fibrillation, dementia, CKD 2, depression, thyroid nodule, hx of breast CA on letrozole. Patient hospitalized 7/21-7/24/23 s/p mechanical fall sustained femoral neck fracture  with repair. Cassie Lopez resides LTC at [facility]. Cassie Lopez is able to maneuver for transfers, requires assistance for ADL's bathing, dressing, does feed herself with fair appetite per staff. Staff endorses no new changes including no recent falls, wounds, infections, hospitalizations. Purpose of today PC f/u visit further discussion monitor trends of appetite, weights, monitor for functional, cognitive decline with chronic disease progression, assess any active symptoms, supportive role. At present Cassie Lopez is sitting in the chair in her room. We talked about purpose of pc visit,  ros, her functional abilities. Cassie Lopez was sharpe with her answers, short words answering questions and said she was "fine, do not need anything". Reviewed appetite, weights, per staff  no other changes. Supportive role. Visit limited with Cassie Lopez short answers, no current needs. Medical goals, medications, poc reviewed. Cassie Lopez is stable, continue current poc.    History obtained from review of EMR, discussion with primary team, and interview with family, facility staff/caregiver and/or Cassie Lopez.  I reviewed available labs, medications, imaging, studies and related documents from the EMR.  Records reviewed and summarized above.    Physical: General: frail appearing, female ENMT: oral mucous membranes moist Skin: warm and dry Neuro:  alert, oriented Psych: flat affect Thank you for the opportunity to participate in the care of Cassie Lopez. Please call our office at [phone] if we can be of additional assistance.   Yanisa Goodgame Ihor Gully, NP

## 2022-10-29 ENCOUNTER — Encounter: Payer: Self-pay | Admitting: Nurse Practitioner

## 2022-10-29 ENCOUNTER — Non-Acute Institutional Stay: Payer: Medicare Other | Admitting: Nurse Practitioner

## 2022-10-29 DIAGNOSIS — E46 Unspecified protein-calorie malnutrition: Secondary | ICD-10-CM

## 2022-10-29 DIAGNOSIS — F03918 Unspecified dementia, unspecified severity, with other behavioral disturbance: Secondary | ICD-10-CM

## 2022-10-29 DIAGNOSIS — Z515 Encounter for palliative care: Secondary | ICD-10-CM

## 2022-10-29 NOTE — Progress Notes (Signed)
[facility] Palliative Care Consult Note Telephone: [phone]  Fax: [phone]    Date of encounter: 10/29/22 12:07 PM PATIENT NAME: Cassie Lobo, PhD [address] [place] [address]   [phone] (home)  DOB: [birth year] MRN: [id number] PRIMARY CARE PROVIDER:    Lauro Regulus, MD,  [facility]  RESPONSIBLE PARTY:    Contact Information       Name Relation Home Work Mobile    [place] Sister [phone]   [phone]       I met face to face with patient in the facility. Palliative Care was asked to follow this patient by consultation request of  Lauro Regulus, MD to address advance care planning and complex medical decision making. This is a follow up visit.                                  ASSESSMENT AND PLAN / RECOMMENDATIONS:  Symptom Management/Plan:  1. Advance Care Planning;  DNR 2. Palliative care encounter; Palliative care encounter; Palliative medicine team will continue to support patient, patient's family, and medical team. Visit consisted of counseling and education dealing with the complex and emotionally intense issues of symptom management and palliative care in the setting of serious and potentially life-threatening illness 3. Dementia with behavioral disturbance- stable, supportive role, encourage socialization, appetite, coping strategies   4. protein calorie malnutrition- stable, weight gain; continue to encourage [name] to eat, snacks, supplements, reviewed weights, continue to weigh. Routine labs 06/14/2022 weight 160.6 lbs 08/15/2022 weight 165.4 lbs 09/16/2022 weight 172.9 lbs 10/15/2022 weight 168.9 lbs Follow up Palliative Care Visit: PC f/u visit further discussion monitor trends of appetite, weights, monitor for functional, cognitive decline with chronic disease progression, assess any active symptoms, supportive role.Palliative care will continue to follow for complex  medical decision making, advance care planning, and clarification of goals. Return in 4-8 weeks or prn. PPS: 50% Chief Complaint: Follow up palliative consult for complex medical decision making, address goals, manage ongoing symptoms I spent 45 minutes providing this consultation. More than 50% of the time in this consultation was spent in counseling and care coordination.    HISTORY OF PRESENT ILLNESS:  Cassie Lobo, PhD is a 82 y.o. year old female  with hypertension, diastolic heart failure, permanent atrial fibrillation, dementia, CKD 2, depression, thyroid nodule, hx of breast CA on letrozole. Patient hospitalized 7/21-7/24/23 s/p mechanical fall sustained femoral neck fracture  with repair. [name] resides LTC at [facility]. [name] is able to maneuver for transfers, requires assistance for ADL's bathing, dressing, does feed herself with fair appetite per staff. Staff endorses no new changes including no recent falls, wounds, infections, hospitalizations. Purpose of today PC f/u visit further discussion monitor trends of appetite, weights, monitor for functional, cognitive decline with chronic disease progression, assess any active symptoms, supportive role. At present [name] is sitting in the chair in her room. We talked about purpose of pc visit, ros, her functional abilities. [name] was very stoic in her answers saying she was fine, did not need anything. Attempted to have discussions though [name] one word answers, support provided. Medical goals, poc, medications reviewed, no new changes of concerns, updated staff.    History obtained from review of EMR, discussion with primary team, and interview with family, facility staff/caregiver and/or [name].  I reviewed available labs, medications,  imaging, studies and related documents from the EMR.  Records reviewed and summarized above.    Physical: General: frail appearing, female ENMT: oral mucous membranes  moist Skin: warm and dry Neuro:  alert, oriented Psych: flat affect   Thank you for the opportunity to participate in the care of [name]. Please call our office at [phone] if we can be of additional assistance.   Audreana Hancox Prince Rome, NP

## 2022-12-17 ENCOUNTER — Non-Acute Institutional Stay: Payer: Medicare Other | Admitting: Nurse Practitioner

## 2022-12-17 ENCOUNTER — Encounter: Payer: Self-pay | Admitting: Nurse Practitioner

## 2022-12-17 DIAGNOSIS — Z515 Encounter for palliative care: Secondary | ICD-10-CM

## 2022-12-17 DIAGNOSIS — E46 Unspecified protein-calorie malnutrition: Secondary | ICD-10-CM

## 2022-12-17 DIAGNOSIS — F03918 Unspecified dementia, unspecified severity, with other behavioral disturbance: Secondary | ICD-10-CM

## 2022-12-17 NOTE — Progress Notes (Signed)
Therapist, nutritional Palliative Care Consult Note Telephone: [phone]  Fax: [phone]    Date of encounter: 12/17/22 4:18 PM PATIENT NAME: Cassie Lobo, PhD [address] [place] [address]   [phone] (home)  DOB: June 24, 1941 MRN: [id number] PRIMARY CARE PROVIDER:    Lauro Regulus, MD,  [facility]  RESPONSIBLE PARTY:    Contact Information     Name Relation Home Work Mobile   Cassie Lopez Sister [phone]  [phone]      I  met face to face with patient in the facility. Palliative Care was asked to follow this patient by consultation request of  Cassie Regulus, MD to address advance care planning and complex medical decision making. This is a follow up visit.                                  ASSESSMENT AND PLAN / RECOMMENDATIONS:  Symptom Management/Plan:  1. Advance Care Planning;  DNR 2. Palliative care encounter; Palliative care encounter; Palliative medicine team will continue to support patient, patient's family, and medical team. Visit consisted of counseling and education dealing with the complex and emotionally intense issues of symptom management and palliative care in the setting of serious and potentially life-threatening illness 3. Dementia with behavioral disturbance- stable, supportive role, encourage socialization, appetite, coping strategies   4. protein calorie malnutrition- stable, weight gain; continue to encourage Cassie Lopez to eat, snacks, supplements, reviewed weights, continue to weigh. Routine labs  09/16/2022 weight 172.9 lbs 10/15/2022 weight 168.9 lbs 11/13/2022 weight 173.1 lbs Follow up Palliative Care Visit: PC f/u visit further discussion monitor trends of appetite, weights, monitor for functional, cognitive decline with chronic disease progression, assess any active symptoms, supportive role.Palliative care will continue to follow for complex medical decision making, advance  care planning, and clarification of goals. Return in 2-8 weeks or prn. PPS: 50% Chief Complaint: Follow up palliative consult for complex medical decision making, address goals, manage ongoing symptoms I spent 46 minutes providing this consultation. More than 50% of the time in this consultation was spent in counseling and care coordination.    HISTORY OF PRESENT ILLNESS:  Cassie Lobo, PhD is a 82 y.o. year old female  with hypertension, diastolic heart failure, permanent atrial fibrillation, dementia, CKD 2, depression, thyroid nodule, hx of breast CA on letrozole. Patient hospitalized 7/21-7/24/23 s/p mechanical fall sustained femoral neck fracture  with repair. Cassie Lopez resides LTC at [facility]. Cassie Lopez is able to maneuver for transfers, requires assistance for ADL's bathing, dressing, does feed herself with fair appetite per staff. Staff endorses no new changes including no recent falls, wounds, infections, hospitalizations. Purpose of today PC f/u visit further discussion monitor trends of appetite, weights, monitor for functional, cognitive decline with chronic disease progression, assess any active symptoms, supportive role. At present Cassie Lopez is sitting in the chair in her room. We talked about weight gain, appetite, ros, praised for being oob, attempted to engage Cassie Lopez in conversation and asking her questions. She was more interactive though continued to be limited with her responses, though she did smile at this visit. Cassie Lopez and I talked about daily routine, what brings her joy, though limited. Cassie Lopez was cooperative with assessment. Medical goals, poc, medications reviewed, no new changes of concerns, updated staff. PC f/u visit further discussion monitor trends of appetite, weights, monitor for functional,  cognitive decline with chronic disease progression, assess any active symptoms, supportive role.   History obtained from review of EMR, discussion with primary team,  and interview with family, facility staff/caregiver and/or Cassie Lopez.  I reviewed available labs, medications, imaging, studies and related documents from the EMR.  Records reviewed and summarized above.    Physical: General: frail appearing, female ENMT: oral mucous membranes moist Pulm: Breath sounds clear Cardio: HSR Skin: warm and dry Neuro:  alert, oriented Psych: flat affect Thank you for the opportunity to participate in the care of Cassie Lopez. Please call our office at [phone] if we can be of additional assistance.   Cassie Inclan Prince Rome, NP

## 2024-01-13 DEATH — deceased
